# Patient Record
Sex: Male | Born: 1960 | Race: White | Hispanic: No | Marital: Married | Smoking: Former smoker
Health system: Southern US, Community
[De-identification: ages and names within clinical notes are randomized; demographics above are authoritative.]

## PROBLEM LIST (undated history)

## (undated) DIAGNOSIS — J45909 Unspecified asthma, uncomplicated: Secondary | ICD-10-CM

## (undated) DIAGNOSIS — K219 Gastro-esophageal reflux disease without esophagitis: Secondary | ICD-10-CM

## (undated) DIAGNOSIS — G4730 Sleep apnea, unspecified: Secondary | ICD-10-CM

## (undated) DIAGNOSIS — I10 Essential (primary) hypertension: Secondary | ICD-10-CM

## (undated) DIAGNOSIS — Z8489 Family history of other specified conditions: Secondary | ICD-10-CM

## (undated) HISTORY — PX: TONSILLECTOMY: SUR1361

## (undated) HISTORY — PX: WISDOM TOOTH EXTRACTION: SHX21

---

## 2015-05-23 ENCOUNTER — Other Ambulatory Visit: Payer: Self-pay | Admitting: Orthopedic Surgery

## 2015-05-24 ENCOUNTER — Other Ambulatory Visit: Payer: Self-pay | Admitting: Orthopedic Surgery

## 2015-05-24 DIAGNOSIS — M542 Cervicalgia: Secondary | ICD-10-CM

## 2015-05-24 DIAGNOSIS — M25511 Pain in right shoulder: Secondary | ICD-10-CM

## 2015-05-27 ENCOUNTER — Other Ambulatory Visit: Payer: Self-pay | Admitting: Orthopedic Surgery

## 2015-05-27 DIAGNOSIS — Z77018 Contact with and (suspected) exposure to other hazardous metals: Secondary | ICD-10-CM

## 2015-06-20 ENCOUNTER — Ambulatory Visit
Admission: RE | Admit: 2015-06-20 | Discharge: 2015-06-20 | Disposition: A | Discharge status: Home / Self Care | Payer: Commercial Managed Care - HMO | Source: Ambulatory Visit | Attending: Orthopedic Surgery | Admitting: Orthopedic Surgery

## 2015-06-20 ENCOUNTER — Other Ambulatory Visit: Payer: Self-pay

## 2015-06-20 DIAGNOSIS — M25511 Pain in right shoulder: Secondary | ICD-10-CM

## 2015-06-20 DIAGNOSIS — M542 Cervicalgia: Secondary | ICD-10-CM

## 2015-06-20 DIAGNOSIS — Z77018 Contact with and (suspected) exposure to other hazardous metals: Secondary | ICD-10-CM

## 2015-06-20 MED ORDER — IOHEXOL 180 MG/ML  SOLN
15.0000 mL | Freq: Once | INTRAMUSCULAR | Status: DC | PRN
  Administered 2015-06-20: 15 mL via INTRA_ARTICULAR

## 2015-11-14 ENCOUNTER — Other Ambulatory Visit: Payer: Self-pay | Admitting: Orthopedic Surgery

## 2015-11-24 ENCOUNTER — Encounter (HOSPITAL_COMMUNITY): Payer: Self-pay

## 2015-11-24 ENCOUNTER — Encounter (HOSPITAL_COMMUNITY)
Admission: RE | Admit: 2015-11-24 | Discharge: 2015-11-24 | Disposition: A | Discharge status: Home / Self Care | Payer: Managed Care, Other (non HMO) | Source: Ambulatory Visit | Attending: Orthopedic Surgery | Admitting: Orthopedic Surgery

## 2015-11-24 DIAGNOSIS — Z01812 Encounter for preprocedural laboratory examination: Secondary | ICD-10-CM | POA: Insufficient documentation

## 2015-11-24 DIAGNOSIS — M75101 Unspecified rotator cuff tear or rupture of right shoulder, not specified as traumatic: Secondary | ICD-10-CM | POA: Diagnosis not present

## 2015-11-24 HISTORY — DX: Essential (primary) hypertension: I10

## 2015-11-24 HISTORY — DX: Gastro-esophageal reflux disease without esophagitis: K21.9

## 2015-11-24 LAB — BASIC METABOLIC PANEL
ANION GAP: 10 (ref 5–15)
BUN: 18 mg/dL (ref 6–20)
CHLORIDE: 105 mmol/L (ref 101–111)
CO2: 24 mmol/L (ref 22–32)
Calcium: 9.7 mg/dL (ref 8.9–10.3)
Creatinine, Ser: 1.04 mg/dL (ref 0.61–1.24)
GFR calc non Af Amer: >60 mL/min (ref >60)
Glucose, Bld: 115 mg/dL — ABNORMAL HIGH (ref 65–99)
POTASSIUM: 4.2 mmol/L (ref 3.5–5.1)
Sodium: 139 mmol/L (ref 135–145)

## 2015-11-24 LAB — CBC
HEMATOCRIT: 41.2 % (ref 39.0–52.0)
HEMOGLOBIN: 14.2 g/dL (ref 13.0–17.0)
MCH: 30.7 pg (ref 26.0–34.0)
MCHC: 34.5 g/dL (ref 30.0–36.0)
MCV: 89 fL (ref 78.0–100.0)
PLATELETS: 239 10*3/uL (ref 150–400)
RBC: 4.63 MIL/uL (ref 4.22–5.81)
RDW: 12.6 % (ref 11.5–15.5)
WBC: 8.5 10*3/uL (ref 4.0–10.5)

## 2015-11-24 MED ORDER — CHLORHEXIDINE GLUCONATE 4 % EX LIQD: 60.0000 mL | Freq: Once | CUTANEOUS | Status: DC

## 2015-11-24 NOTE — Pre-Procedure Instructions (Signed)
    Keith RaddleMichael P Walters  11/24/2015      [facility] - [place] - [id number] [place] [id number] Phone: [phone] Fax: [phone]    Your procedure is scheduled on December 01, 2015.  [facility]:30 A.M.  Call this number if you have problems the morning of surgery:  [phone]   Remember:  Do not eat food or drink liquids after midnight.  Take these medicines the morning of surgery with A SIP OF WATER : omeprazole (PRILOSEC)   STOP ASPIRIN, HERBAL MEDICATIONS, FISH OIL AS OF TODAY   Do not wear jewelry, make-up or nail polish.  Do not wear lotions, powders, or perfumes.  You may wear deodorant.  Men may shave face and neck.  Do not bring valuables to the hospital.  [facility] is not [facility] for any belongings or valuables.  Contacts, dentures or bridgework may not be worn into surgery.  Leave your suitcase in the car.  After surgery it may be brought to your room.  For patients admitted to the hospital, discharge time will be determined by your treatment team.  Patients discharged the day of surgery will not be allowed to drive home.   Name and phone number of your driver:    Special instructions:  PREPARING FOR SURGERY  Please read over the following fact sheets that you were given. Pain Booklet, Coughing and Deep Breathing and Surgical Site Infection Prevention

## 2015-11-30 MED ORDER — CEFAZOLIN SODIUM-DEXTROSE 2-4 GM/100ML-% IV SOLN
2.0000 g | INTRAVENOUS | Status: AC
  Administered 2015-12-01: 2 g via INTRAVENOUS
  Filled 2015-11-30: qty 100

## 2015-11-30 NOTE — H&P (Signed)
Keith Walters is an 55 y.o. male.   Chief Complaint: Right shoulder pain HPI: Keith NeedleMichael is a 55 year old patient with long history of right shoulder pain.  He has failed conservative management including injections and anti-inflammatories.  He does have a rotator cuff tear by MRI scanning over a year ago.  Reports continued symptoms relating to the right shoulder with coarseness grinding crepitus pain as well as weakness.  He presents now for operative management after expansion risk benefits  Past Medical History  Diagnosis Date  . GERD (gastroesophageal reflux disease)   . Hypertension     No past surgical history on file.  No family history on file. Social History:  has no tobacco, alcohol, and drug history on file.  Allergies:  Allergies  Allergen Reactions  . Codeine     No prescriptions prior to admission    No results found for this or any previous visit (from the past 48 hour(s)). No results found.  Review of Systems  Constitutional: Negative.   HENT: Negative.   Eyes: Negative.   Respiratory: Negative.   Cardiovascular: Negative.   Gastrointestinal: Negative.   Genitourinary: Negative.   Musculoskeletal: Positive for joint pain.  Skin: Negative.   Neurological: Negative.   Endo/Heme/Allergies: Negative.   Psychiatric/Behavioral: Negative.     There were no vitals taken for this visit. Physical Exam  Constitutional: He appears well-developed.  HENT:  Head: Normocephalic.  Eyes: Pupils are equal, round, and reactive to light.  Neck: Normal range of motion.  Cardiovascular: Normal rate.   Respiratory: Effort normal.  Neurological: He is alert.  Skin: Skin is warm.  Psychiatric: He has a normal mood and affect.   right shoulder demonstrates good passive range of motion weakness to suture status testing impingement signs positive no definite before meals joint tenderness direct palpation right versus left shoulder stability is good anterior posterior inferior  testing O'Brien's testing negative  Assessment/Plan Impression is right shoulder rotator cuff tear symptomatic and the patient does fairly physical things plan arthroscopy evaluation the biceps tendon subacromial decompression and mini open rotator cuff repair possible biceps tenodesis if that is indicated based on presence or absence of labral or biceps pathology patient extends the risks and benefits including not limited to infection or vessel damage shoulder stiffness incomplete pain relief piece the rehabilitation time is also discussed.  Patient will use a CPM machine postop all QUESTIONS answered  Keith Walters,Keith Walters SCOTT, MD 11/30/2015, 12:52 PM

## 2015-11-30 NOTE — Anesthesia Preprocedure Evaluation (Addendum)
Anesthesia Evaluation  Patient identified by MRN, date of birth, ID band Patient awake    Reviewed: Allergy & Precautions, NPO status , Patient's Chart, lab work & pertinent test results  Airway Mallampati: II  TM Distance: >3 FB Neck ROM: Full    Dental  (+) Dental Advisory Given   Pulmonary neg pulmonary ROS, former smoker,    breath sounds clear to auscultation       Cardiovascular hypertension, Pt. on medications  Rhythm:Regular Rate:Normal     Neuro/Psych negative neurological ROS     GI/Hepatic Neg liver ROS, GERD  ,  Endo/Other  negative endocrine ROS  Renal/GU negative Renal ROS     Musculoskeletal   Abdominal   Peds  Hematology negative hematology ROS (+)   Anesthesia Other Findings   Reproductive/Obstetrics                            Lab Results  Component Value Date   WBC 8.5 11/24/2015   HGB 14.2 11/24/2015   HCT 41.2 11/24/2015   MCV 89.0 11/24/2015   PLT 239 11/24/2015   Lab Results  Component Value Date   CREATININE 1.04 11/24/2015   BUN 18 11/24/2015   NA 139 11/24/2015   K 4.2 11/24/2015   CL 105 11/24/2015   CO2 24 11/24/2015    Anesthesia Physical Anesthesia Plan  ASA: II  Anesthesia Plan: General and Regional   Post-op Pain Management: GA combined w/ Regional for post-op pain   Induction: Intravenous  Airway Management Planned: Oral ETT  Additional Equipment:   Intra-op Plan:   Post-operative Plan: Extubation in OR  Informed Consent: I have reviewed the patients History and Physical, chart, labs and discussed the procedure including the risks, benefits and alternatives for the proposed anesthesia with the patient or authorized representative who has indicated his/her understanding and acceptance.   Dental advisory given  Plan Discussed with: CRNA  Anesthesia Plan Comments:         Anesthesia Quick Evaluation

## 2015-12-01 ENCOUNTER — Encounter (HOSPITAL_COMMUNITY)
Admission: RE | Disposition: A | Discharge status: Home / Self Care | Payer: Self-pay | Source: Ambulatory Visit | Attending: Orthopedic Surgery

## 2015-12-01 ENCOUNTER — Ambulatory Visit (HOSPITAL_COMMUNITY): Payer: Managed Care, Other (non HMO) | Admitting: Anesthesiology

## 2015-12-01 ENCOUNTER — Ambulatory Visit (HOSPITAL_COMMUNITY)
Admission: RE | Admit: 2015-12-01 | Discharge: 2015-12-01 | Disposition: A | Discharge status: Home / Self Care | Payer: Managed Care, Other (non HMO) | Source: Ambulatory Visit | Attending: Orthopedic Surgery | Admitting: Orthopedic Surgery

## 2015-12-01 ENCOUNTER — Encounter (HOSPITAL_COMMUNITY): Payer: Self-pay | Admitting: *Deleted

## 2015-12-01 DIAGNOSIS — Z87891 Personal history of nicotine dependence: Secondary | ICD-10-CM | POA: Diagnosis not present

## 2015-12-01 DIAGNOSIS — K219 Gastro-esophageal reflux disease without esophagitis: Secondary | ICD-10-CM | POA: Insufficient documentation

## 2015-12-01 DIAGNOSIS — Z79899 Other long term (current) drug therapy: Secondary | ICD-10-CM | POA: Diagnosis not present

## 2015-12-01 DIAGNOSIS — M7521 Bicipital tendinitis, right shoulder: Secondary | ICD-10-CM | POA: Insufficient documentation

## 2015-12-01 DIAGNOSIS — I10 Essential (primary) hypertension: Secondary | ICD-10-CM | POA: Insufficient documentation

## 2015-12-01 DIAGNOSIS — M75101 Unspecified rotator cuff tear or rupture of right shoulder, not specified as traumatic: Secondary | ICD-10-CM | POA: Insufficient documentation

## 2015-12-01 DIAGNOSIS — Z5333 Arthroscopic surgical procedure converted to open procedure: Secondary | ICD-10-CM | POA: Insufficient documentation

## 2015-12-01 DIAGNOSIS — Z7982 Long term (current) use of aspirin: Secondary | ICD-10-CM | POA: Insufficient documentation

## 2015-12-01 HISTORY — PX: SHOULDER ARTHROSCOPY WITH ROTATOR CUFF REPAIR AND SUBACROMIAL DECOMPRESSION: SHX5686

## 2015-12-01 SURGERY — SHOULDER ARTHROSCOPY WITH ROTATOR CUFF REPAIR AND SUBACROMIAL DECOMPRESSION
Anesthesia: Regional | Site: Shoulder | Laterality: Right

## 2015-12-01 MED ORDER — DEXTROSE 5 % IV SOLN
INTRAVENOUS | Status: DC | PRN
Start: 2015-12-01 — End: 2015-12-01
  Administered 2015-12-01: 07:00:00 via INTRAVENOUS

## 2015-12-01 MED ORDER — DEXAMETHASONE SODIUM PHOSPHATE 10 MG/ML IJ SOLN
INTRAMUSCULAR | Status: AC
  Filled 2015-12-01: qty 1

## 2015-12-01 MED ORDER — FENTANYL CITRATE (PF) 250 MCG/5ML IJ SOLN
INTRAMUSCULAR | Status: AC
  Filled 2015-12-01: qty 5

## 2015-12-01 MED ORDER — SUCCINYLCHOLINE CHLORIDE 20 MG/ML IJ SOLN
INTRAMUSCULAR | Status: DC | PRN
  Administered 2015-12-01: 100 mg via INTRAVENOUS

## 2015-12-01 MED ORDER — 0.9 % SODIUM CHLORIDE (POUR BTL) OPTIME
TOPICAL | Status: DC | PRN
  Administered 2015-12-01 (×3): 1000 mL

## 2015-12-01 MED ORDER — PHENYLEPHRINE 40 MCG/ML (10ML) SYRINGE FOR IV PUSH (FOR BLOOD PRESSURE SUPPORT)
PREFILLED_SYRINGE | INTRAVENOUS | Status: AC
  Filled 2015-12-01: qty 10

## 2015-12-01 MED ORDER — EPINEPHRINE HCL 1 MG/ML IJ SOLN
INTRAMUSCULAR | Status: DC | PRN
  Administered 2015-12-01: .1 mL

## 2015-12-01 MED ORDER — HYDROMORPHONE HCL 1 MG/ML IJ SOLN
0.2500 mg | INTRAMUSCULAR | Status: DC | PRN
Start: 2015-12-01 — End: 2015-12-01

## 2015-12-01 MED ORDER — SUGAMMADEX SODIUM 200 MG/2ML IV SOLN
INTRAVENOUS | Status: DC | PRN
  Administered 2015-12-01: 200 mg via INTRAVENOUS

## 2015-12-01 MED ORDER — BUPIVACAINE-EPINEPHRINE (PF) 0.5% -1:200000 IJ SOLN
INTRAMUSCULAR | Status: DC | PRN
  Administered 2015-12-01: 25 mL via PERINEURAL

## 2015-12-01 MED ORDER — KETOROLAC TROMETHAMINE 30 MG/ML IJ SOLN
INTRAMUSCULAR | Status: AC
  Filled 2015-12-01: qty 1

## 2015-12-01 MED ORDER — FENTANYL CITRATE (PF) 100 MCG/2ML IJ SOLN
INTRAMUSCULAR | Status: DC | PRN
  Administered 2015-12-01: 100 ug via INTRAVENOUS
  Administered 2015-12-01 (×2): 50 ug via INTRAVENOUS

## 2015-12-01 MED ORDER — ROCURONIUM BROMIDE 50 MG/5ML IV SOLN
INTRAVENOUS | Status: AC
  Filled 2015-12-01: qty 1

## 2015-12-01 MED ORDER — SUCCINYLCHOLINE CHLORIDE 200 MG/10ML IV SOSY
PREFILLED_SYRINGE | INTRAVENOUS | Status: AC
  Filled 2015-12-01: qty 10

## 2015-12-01 MED ORDER — SUGAMMADEX SODIUM 200 MG/2ML IV SOLN
INTRAVENOUS | Status: AC
  Filled 2015-12-01: qty 2

## 2015-12-01 MED ORDER — ATROPINE SULFATE 0.4 MG/ML IV SOSY
PREFILLED_SYRINGE | INTRAVENOUS | Status: AC
  Filled 2015-12-01: qty 2.5

## 2015-12-01 MED ORDER — METHOCARBAMOL 500 MG PO TABS: 500.0000 mg | ORAL_TABLET | Freq: Four times a day (QID) | ORAL | Status: DC

## 2015-12-01 MED ORDER — DEXAMETHASONE SODIUM PHOSPHATE 10 MG/ML IJ SOLN
INTRAMUSCULAR | Status: DC | PRN
  Administered 2015-12-01: 10 mg via INTRAVENOUS

## 2015-12-01 MED ORDER — SODIUM CHLORIDE 0.9 % IR SOLN
Status: DC | PRN
  Administered 2015-12-01: 3000 mL

## 2015-12-01 MED ORDER — LACTATED RINGERS IV SOLN
INTRAVENOUS | Status: DC | PRN
  Administered 2015-12-01 (×2): via INTRAVENOUS

## 2015-12-01 MED ORDER — MIDAZOLAM HCL 2 MG/2ML IJ SOLN
INTRAMUSCULAR | Status: AC
  Filled 2015-12-01: qty 2

## 2015-12-01 MED ORDER — SODIUM CHLORIDE 0.9 % IJ SOLN
INTRAMUSCULAR | Status: DC | PRN
  Administered 2015-12-01: 50 mL via INTRAVENOUS

## 2015-12-01 MED ORDER — ROCURONIUM BROMIDE 100 MG/10ML IV SOLN
INTRAVENOUS | Status: DC | PRN
Start: 2015-12-01 — End: 2015-12-01
  Administered 2015-12-01: 50 mg via INTRAVENOUS
  Administered 2015-12-01: 20 mg via INTRAVENOUS

## 2015-12-01 MED ORDER — ARTIFICIAL TEARS OP OINT
TOPICAL_OINTMENT | OPHTHALMIC | Status: AC
  Filled 2015-12-01: qty 7

## 2015-12-01 MED ORDER — LIDOCAINE 2% (20 MG/ML) 5 ML SYRINGE
INTRAMUSCULAR | Status: AC
  Filled 2015-12-01: qty 5

## 2015-12-01 MED ORDER — ONDANSETRON HCL 4 MG/2ML IJ SOLN
INTRAMUSCULAR | Status: DC | PRN
  Administered 2015-12-01: 4 mg via INTRAVENOUS

## 2015-12-01 MED ORDER — ONDANSETRON HCL 4 MG/2ML IJ SOLN
INTRAMUSCULAR | Status: AC
  Filled 2015-12-01: qty 2

## 2015-12-01 MED ORDER — MIDAZOLAM HCL 5 MG/5ML IJ SOLN
INTRAMUSCULAR | Status: DC | PRN
  Administered 2015-12-01: 2 mg via INTRAVENOUS

## 2015-12-01 MED ORDER — PROPOFOL 10 MG/ML IV BOLUS
INTRAVENOUS | Status: AC
  Filled 2015-12-01: qty 40

## 2015-12-01 MED ORDER — LIDOCAINE HCL (CARDIAC) 20 MG/ML IV SOLN
INTRAVENOUS | Status: DC | PRN
  Administered 2015-12-01: 20 mg via INTRAVENOUS

## 2015-12-01 MED ORDER — EPHEDRINE 5 MG/ML INJ
INTRAVENOUS | Status: AC
  Filled 2015-12-01: qty 10

## 2015-12-01 MED ORDER — HYDROCODONE-ACETAMINOPHEN 7.5-325 MG PO TABS: 1.0000 | ORAL_TABLET | Freq: Once | ORAL | Status: DC | PRN

## 2015-12-01 MED ORDER — SODIUM CHLORIDE 0.9 % IV SOLN
10.0000 mg | INTRAVENOUS | Status: DC | PRN
  Administered 2015-12-01: 10 ug/min via INTRAVENOUS

## 2015-12-01 MED ORDER — EPINEPHRINE HCL 1 MG/ML IJ SOLN
INTRAMUSCULAR | Status: AC
  Filled 2015-12-01: qty 1

## 2015-12-01 MED ORDER — PROPOFOL 10 MG/ML IV BOLUS
INTRAVENOUS | Status: DC | PRN
  Administered 2015-12-01: 170 mg via INTRAVENOUS

## 2015-12-01 MED ORDER — ARTIFICIAL TEARS OP OINT
TOPICAL_OINTMENT | OPHTHALMIC | Status: DC | PRN
  Administered 2015-12-01: 1 via OPHTHALMIC

## 2015-12-01 MED ORDER — KETOROLAC TROMETHAMINE 30 MG/ML IJ SOLN
30.0000 mg | Freq: Once | INTRAMUSCULAR | Status: AC | PRN
  Administered 2015-12-01: 30 mg via INTRAVENOUS

## 2015-12-01 SURGICAL SUPPLY — 81 items
AID PSTN UNV HD RSTRNT DISP (MISCELLANEOUS) ×1
ANCH SUT 2 FT CRKSW 14.7X5.5 (Anchor) ×2 IMPLANT
ANCH SUT PUSHLCK 19.5X3.5 STRL (Anchor) IMPLANT
ANCH SUT PUSHLCK 24X4.5 STRL (Orthopedic Implant) ×2 IMPLANT
ANCHOR CORKSCREW FIBER 5.5X15 (Anchor) ×4 IMPLANT
ANCHOR PUSHLOCK PEEK 3.5X19.5 (Anchor) IMPLANT
APL SKNCLS STERI-STRIP NONHPOA (GAUZE/BANDAGES/DRESSINGS) ×1
BENZOIN TINCTURE PRP APPL 2/3 (GAUZE/BANDAGES/DRESSINGS) ×3 IMPLANT
BLADE CUTTER GATOR 3.5 (BLADE) ×3 IMPLANT
BLADE GREAT WHITE 4.2 (BLADE) ×2 IMPLANT
BLADE GREAT WHITE 4.2MM (BLADE) ×1
BLADE SURG 11 STRL SS (BLADE) ×3 IMPLANT
BUR OVAL 6.0 (BURR) ×3 IMPLANT
CLOSURE WOUND 1/2 X4 (GAUZE/BANDAGES/DRESSINGS) ×1
COVER SURGICAL LIGHT HANDLE (MISCELLANEOUS) ×3 IMPLANT
DRAPE INCISE IOBAN 66X45 STRL (DRAPES) ×6 IMPLANT
DRAPE STERI 35X30 U-POUCH (DRAPES) ×3 IMPLANT
DRAPE U-SHAPE 47X51 STRL (DRAPES) ×6 IMPLANT
DRSG ADAPTIC 3X8 NADH LF (GAUZE/BANDAGES/DRESSINGS) ×2 IMPLANT
DRSG PAD ABDOMINAL 8X10 ST (GAUZE/BANDAGES/DRESSINGS) ×9 IMPLANT
DRSG TEGADERM 4X4.75 (GAUZE/BANDAGES/DRESSINGS) ×4 IMPLANT
DURAPREP 26ML APPLICATOR (WOUND CARE) ×3 IMPLANT
ELECT REM PT RETURN 9FT ADLT (ELECTROSURGICAL) ×3
ELECTRODE REM PT RTRN 9FT ADLT (ELECTROSURGICAL) ×1 IMPLANT
FILTER STRAW FLUID ASPIR (MISCELLANEOUS) ×3 IMPLANT
GAUZE SPONGE 4X4 12PLY STRL (GAUZE/BANDAGES/DRESSINGS) ×3 IMPLANT
GAUZE XEROFORM 1X8 LF (GAUZE/BANDAGES/DRESSINGS) ×3 IMPLANT
GLOVE BIOGEL PI IND STRL 7.5 (GLOVE) ×1 IMPLANT
GLOVE BIOGEL PI IND STRL 8 (GLOVE) ×1 IMPLANT
GLOVE BIOGEL PI INDICATOR 7.5 (GLOVE) ×2
GLOVE BIOGEL PI INDICATOR 8 (GLOVE) ×2
GLOVE ECLIPSE 7.0 STRL STRAW (GLOVE) ×3 IMPLANT
GLOVE SURG ORTHO 8.0 STRL STRW (GLOVE) ×3 IMPLANT
GOWN STRL REUS W/ TWL LRG LVL3 (GOWN DISPOSABLE) IMPLANT
GOWN STRL REUS W/ TWL XL LVL3 (GOWN DISPOSABLE) ×2 IMPLANT
GOWN STRL REUS W/TWL LRG LVL3 (GOWN DISPOSABLE) ×6
GOWN STRL REUS W/TWL XL LVL3 (GOWN DISPOSABLE) ×6
KIT BASIN OR (CUSTOM PROCEDURE TRAY) ×3 IMPLANT
KIT BIO-TENODESIS 3X8 DISP (MISCELLANEOUS) ×3
KIT INSRT BABSR STRL DISP BTN (MISCELLANEOUS) IMPLANT
KIT ROOM TURNOVER OR (KITS) ×3 IMPLANT
MANIFOLD NEPTUNE II (INSTRUMENTS) ×3 IMPLANT
NDL HYPO 25X1 1.5 SAFETY (NEEDLE) ×1 IMPLANT
NDL SCORPION MULTI FIRE (NEEDLE) ×1 IMPLANT
NDL SPNL 18GX3.5 QUINCKE PK (NEEDLE) ×1 IMPLANT
NDL SUT 6 .5 CRC .975X.05 MAYO (NEEDLE) ×1 IMPLANT
NEEDLE HYPO 25X1 1.5 SAFETY (NEEDLE) ×3 IMPLANT
NEEDLE MAYO TAPER (NEEDLE) ×3
NEEDLE SCORPION MULTI FIRE (NEEDLE) ×3 IMPLANT
NEEDLE SPNL 18GX3.5 QUINCKE PK (NEEDLE) ×3 IMPLANT
NS IRRIG 1000ML POUR BTL (IV SOLUTION) ×7 IMPLANT
PACK SHOULDER (CUSTOM PROCEDURE TRAY) ×3 IMPLANT
PAD ARMBOARD 7.5X6 YLW CONV (MISCELLANEOUS) ×6 IMPLANT
PUSHLOCK PEEK 4.5X24 (Orthopedic Implant) ×4 IMPLANT
RESTRAINT HEAD UNIVERSAL NS (MISCELLANEOUS) ×3 IMPLANT
SCREW TENODESIS BIOCOMP 7MM (Screw) ×2 IMPLANT
SET ARTHROSCOPY TUBING (MISCELLANEOUS) ×3
SET ARTHROSCOPY TUBING LN (MISCELLANEOUS) ×1 IMPLANT
SLING ARM IMMOBILIZER LRG (SOFTGOODS) ×2 IMPLANT
SLING ARM IMMOBILIZER MED (SOFTGOODS) IMPLANT
SPONGE LAP 4X18 X RAY DECT (DISPOSABLE) ×6 IMPLANT
STRIP CLOSURE SKIN 1/2X4 (GAUZE/BANDAGES/DRESSINGS) ×2 IMPLANT
SUCTION FRAZIER HANDLE 10FR (MISCELLANEOUS) ×2
SUCTION TUBE FRAZIER 10FR DISP (MISCELLANEOUS) ×1 IMPLANT
SUT ETHILON 3 0 PS 1 (SUTURE) ×3 IMPLANT
SUT FIBERWIRE #2 38 T-5 BLUE (SUTURE)
SUT PROLENE 3 0 PS 2 (SUTURE) ×3 IMPLANT
SUT VIC AB 0 CT1 27 (SUTURE) ×6
SUT VIC AB 0 CT1 27XBRD ANBCTR (SUTURE) ×2 IMPLANT
SUT VIC AB 1 CT1 27 (SUTURE) ×3
SUT VIC AB 1 CT1 27XBRD ANBCTR (SUTURE) ×1 IMPLANT
SUT VIC AB 2-0 CT1 27 (SUTURE) ×3
SUT VIC AB 2-0 CT1 TAPERPNT 27 (SUTURE) ×1 IMPLANT
SUT VICRYL 0 UR6 27IN ABS (SUTURE) ×3 IMPLANT
SUTURE FIBERWR #2 38 T-5 BLUE (SUTURE) IMPLANT
SYR 20CC LL (SYRINGE) ×6 IMPLANT
SYR TB 1ML LUER SLIP (SYRINGE) ×3 IMPLANT
TOWEL OR 17X24 6PK STRL BLUE (TOWEL DISPOSABLE) ×3 IMPLANT
TOWEL OR 17X26 10 PK STRL BLUE (TOWEL DISPOSABLE) ×3 IMPLANT
WAND HAND CNTRL MULTIVAC 90 (MISCELLANEOUS) ×2 IMPLANT
WATER STERILE IRR 1000ML POUR (IV SOLUTION) ×3 IMPLANT

## 2015-12-01 NOTE — Anesthesia Postprocedure Evaluation (Signed)
Anesthesia Post Note  Patient: Keith Walters  Procedure(s) Performed: Procedure(s) (LRB): RIGHT SHOULDER ARTHROSCOPY WITH SUBACROMIAL DECOMPRESSION, MINI-OPEN ROTATOR CUFF REPAIR AND  BICEPS TENODESIS (Right)  Patient location during evaluation: PACU Anesthesia Type: General and Regional Level of consciousness: awake and alert Pain management: pain level controlled Vital Signs Assessment: post-procedure vital signs reviewed and stable Respiratory status: spontaneous breathing, nonlabored ventilation and respiratory function stable Cardiovascular status: blood pressure returned to baseline and stable Postop Assessment: no signs of nausea or vomiting Anesthetic complications: no    Last Vitals:  Filed Vitals:   12/01/15 1130 12/01/15 1145  BP:  142/91  Pulse: 85 83  Temp:    Resp: 21     Last Pain:  Filed Vitals:   12/01/15 1151  PainSc: 0-No pain                 Kennieth RadFitzgerald, Indy Prestwood E

## 2015-12-01 NOTE — Op Note (Signed)
NAMLilyan Punt:  Walters, Keith Walters                ACCOUNT NO.:  [id number]  MEDICAL RECORD NO.:  [id number]  LOCATION:  [facility]                         FACILITY:  [facility]  PHYSICIAN:  Burnard BuntingG. Scott Kamdin Follett, M.D.    DATE OF BIRTH:  1960-10-21  DATE OF PROCEDURE: DATE OF DISCHARGE:                              OPERATIVE REPORT   PREOPERATIVE DIAGNOSIS:  Right shoulder biceps tendonitis and rotator cuff tear.  POSTOPERATIVE DIAGNOSIS:  Right shoulder biceps tendonitis and rotator cuff tear.  PROCEDURE:  Right shoulder arthroscopy with subacromial decompression, biceps tendon release, labral debridement, mini open rotator cuff repair of a 2 x 1.5-cm rotator cuff tear.  SURGEON:  Burnard BuntingG. Scott Ayan Yankey, M.D.  ASSISTANT:  Patrick Jupiterarla Bethune, RNFA.  INDICATIONS:  Keith NeedleMichael is a patient with right shoulder pain, long duration, rotator cuff tear by MRI scanning, presents for operative management after explanation of risks and benefits.  OPERATIVE FINDINGS: 1. Examination under anesthesia, range of motion, 0-170 of forward     flexion, external rotation of 15 degrees, abduction on the right is     about 60 degrees.  The patient had good stability, anterior and     posterior with less than a 1 cm sulcus sign. 2. Diagnostic arthroscopy:     a.     Little bit of early synovitis within the superior capsular      region and on the remaining portion of the rotator cuff.     b.     Frayed labrum and proximal biceps with significant      tendinosis of the intra-articular portion of the biceps with      swelling.     c.     Tear of the rotator cuff with significant tendinosis present      with a tear measuring about 2 cm front to back, 1.5 cm lateral to      medial.  PROCEDURE IN DETAIL:  The patient was brought to the operating room where general anesthetic was induced.  Preoperative antibiotics were administered.  Time-out was called.  Right shoulder was prescrubbed with alcohol and Betadine, allowed to air dry, prepped with  DuraPrep solution and draped in a sterile manner.  The patient was placed in a beach-chair position with the head in neutral position.  Time-out was called.  Collier Flowersoban was used to cover the axilla and most of the operative field during the arthroscopy all of the operative field for the mini open rotator cuff repair portion.  Solution of saline and epinephrine injected in the subacromial space, saline then injected into the glenohumeral joint. Diagnostic arthroscopy was performed through the posterior portal created 2 cm medial and inferior to the posterolateral margin of the acromion.  Diagnostic arthroscopy was performed.  The patient had a type 2 SLAP tear with significant proximal degeneration of the biceps tendon with tendinosis.  He also had a rotator cuff tear, 2 cm x 1.5 cm.  Early synovitis also present in the posterior aspect of the rotator cuff and capsule.  Anterior portal created under direct visualization.  Biceps tendon released.  Labrum debrided.  Rotator cuff tear debrided.  At this time, scope placed in the subacromial space.  Lateral  portal created under direct visualization.  Bursectomy performed.  CA ligament was released, but not resected off the anterolateral part of the acromion. Acromioplasty was performed as the rotator cuff tear was directly under the anterolateral portion of the acromion.  At this time, instruments were removed.  Portals were closed using 3-0 nylon.  Collier Flowers was used to cover the entire operative field.  Incision was made off the anterolateral margin of the acromion.  Skin and subcutaneous tissue were sharply divided.  Deltoid split in its raphe between the anterior and middle portions, avascular zone, stay suture placed 4 cm from the anterolateral margin of the acromion.  Subacromial decompression adequate.  Biceps tendon was then tenodesed using Arthrex 7 x 22-mm tenodesis screw, tenodesed within the bicipital groove.  Good fixation and tension were  achieved.  At this time, rotator cuff was identified, footprint prepared using a curette.  Devitalized portions of the torn rotator cuff was debrided.  Two corkscrews, two PushLocks were then utilized with crossed mattress technique in order to achieve a watertight repair.  Thorough irrigation was then performed.  Deltoid split closed using 0 Vicryl suture followed by interrupted inverted 2-0 Vicryl suture and 3-0 Monocryl.  Impervious dressing was placed.  The patient tolerated the procedure well without immediate complication, transferred to the recovery room in stable condition.     Burnard Bunting, M.D.     GSD/MEDQ  D:  12/01/2015  T:  12/01/2015  Job:  [id number]

## 2015-12-01 NOTE — Transfer of Care (Signed)
Immediate Anesthesia Transfer of Care Note  Patient: Keith RaddleMichael P Walters  Procedure(s) Performed: Procedure(s): RIGHT SHOULDER ARTHROSCOPY WITH SUBACROMIAL DECOMPRESSION, MINI-OPEN ROTATOR CUFF REPAIR AND  BICEPS TENODESIS (Right)  Patient Location: PACU  Anesthesia Type:GA combined with regional for post-op pain  Level of Consciousness: sedated, patient cooperative and responds to stimulation  Airway & Oxygen Therapy: Patient Spontanous Breathing and Patient connected to face mask oxygen  Post-op Assessment: Report given to RN and Post -op Vital signs reviewed and stable  Post vital signs: Reviewed and stable  Last Vitals:  Filed Vitals:   12/01/15 0559  BP: 126/97  Pulse: 67  Temp: 36.4 C  Resp: 20    Last Pain: There were no vitals filed for this visit.       Complications: No apparent anesthesia complications

## 2015-12-01 NOTE — Interval H&P Note (Signed)
History and Physical Interval Note:  12/01/2015 7:30 AM  Keith Walters  has presented today for surgery, with the diagnosis of right shoulder rotator cuff tear  The various methods of treatment have been discussed with the patient and family. After consideration of risks, benefits and other options for treatment, the patient has consented to  Procedure(s): RIGHT SHOULDER ARTHROSCOPY WITH SUBACROMIAL DECOMPRESSION, MINI-OPEN ROTATOR CUFF REPAIR AND POSSIBLE BICEPS TENODESIS (Right) as a surgical intervention .  The patient's history has been reviewed, patient examined, no change in status, stable for surgery.  I have reviewed the patient's chart and labs.  Questions were answered to the patient's satisfaction.     Shawnee Gambone SCOTT

## 2015-12-01 NOTE — Anesthesia Procedure Notes (Addendum)
Anesthesia Regional Block:  Interscalene brachial plexus block  Pre-Anesthetic Checklist: ,, timeout performed, Correct Patient, Correct Site, Correct Laterality, Correct Procedure, Correct Position, site marked, Risks and benefits discussed,  Surgical consent,  Pre-op evaluation,  At surgeon's request and post-op pain management  Laterality: Right  Prep: chloraprep       Needles:  Injection technique: Single-shot  Needle Type: Echogenic Stimulator Needle     Needle Length: 9cm 9 cm Needle Gauge: 21 and 21 G    Additional Needles:  Procedures: ultrasound guided (picture in chart) and nerve stimulator Interscalene brachial plexus block  Nerve Stimulator or Paresthesia:  Response: deltoid and bicep, 0.5 mA,   Additional Responses:   Narrative:  Start time: 12/01/2015 7:10 AM End time: 12/01/2015 7:18 AM Injection made incrementally with aspirations every 5 mL.  Performed by: Personally  Anesthesiologist: Marcene DuosFITZGERALD, ROBERT   Procedure Name: Intubation Date/Time: 12/01/2015 7:43 AM Performed by: Wray KearnsFOLEY, Aleida Crandell A Pre-anesthesia Checklist: Patient identified, Emergency Drugs available, Suction available, Patient being monitored and Timeout performed Patient Re-evaluated:Patient Re-evaluated prior to inductionOxygen Delivery Method: Circle system utilized Preoxygenation: Pre-oxygenation with 100% oxygen Intubation Type: IV induction and Cricoid Pressure applied Ventilation: Oral airway inserted - appropriate to patient size and Mask ventilation with difficulty Laryngoscope Size: Mac and 4 Grade View: Grade I Tube type: Oral Tube size: 7.5 mm Number of attempts: 1 Airway Equipment and Method: Stylet Placement Confirmation: ETT inserted through vocal cords under direct vision,  positive ETCO2 and breath sounds checked- equal and bilateral Secured at: 23 cm Tube secured with: Tape Dental Injury: Teeth and Oropharynx as per pre-operative assessment

## 2015-12-01 NOTE — Brief Op Note (Signed)
12/01/2015  10:02 AM  PATIENT:  Keith Walters  55 y.o. male  PRE-OPERATIVE DIAGNOSIS:  right shoulder rotator cuff tear  POST-OPERATIVE DIAGNOSIS:  right shoulder rotator cuff tear  PROCEDURE:  Procedure(s): RIGHT SHOULDER ARTHROSCOPY WITH SUBACROMIAL DECOMPRESSION, MINI-OPEN ROTATOR CUFF REPAIR AND  BICEPS TENODESIS  SURGEON:  Surgeon(s): Cammy CopaScott Gregory Dean, MD  ASSISTANT: Patrick Jupiterarla Bethune rnfa  ANESTHESIA:   general  EBL: 25 ml    Total I/O In: 600 [I.V.:600] Out: 25 [Blood:25]  BLOOD ADMINISTERED: none  DRAINS: none   LOCAL MEDICATIONS USED:  none  SPECIMEN:  No Specimen  COUNTS:  YES  TOURNIQUET:  * No tourniquets in log *  DICTATION: .Other Dictation: Dictation Number [id number]  PLAN OF CARE: Discharge to home after PACU  PATIENT DISPOSITION:  PACU - hemodynamically stable

## 2015-12-04 ENCOUNTER — Encounter (HOSPITAL_COMMUNITY): Payer: Self-pay | Admitting: Orthopedic Surgery

## 2016-01-20 ENCOUNTER — Other Ambulatory Visit: Payer: Self-pay | Admitting: Orthopedic Surgery

## 2016-01-20 DIAGNOSIS — M25511 Pain in right shoulder: Secondary | ICD-10-CM

## 2016-01-30 ENCOUNTER — Ambulatory Visit
Admission: RE | Admit: 2016-01-30 | Discharge: 2016-01-30 | Disposition: A | Discharge status: Home / Self Care | Payer: Managed Care, Other (non HMO) | Source: Ambulatory Visit | Attending: Orthopedic Surgery | Admitting: Orthopedic Surgery

## 2016-01-30 DIAGNOSIS — M25511 Pain in right shoulder: Secondary | ICD-10-CM

## 2016-01-30 MED ORDER — IOPAMIDOL (ISOVUE-M 200) INJECTION 41%
12.0000 mL | Freq: Once | INTRAMUSCULAR | Status: AC
  Administered 2016-01-30: 12 mL via INTRA_ARTICULAR

## 2016-03-22 ENCOUNTER — Other Ambulatory Visit: Payer: Self-pay | Admitting: Orthopedic Surgery

## 2016-03-28 ENCOUNTER — Other Ambulatory Visit: Payer: Self-pay | Admitting: Orthopedic Surgery

## 2016-04-01 ENCOUNTER — Encounter (HOSPITAL_COMMUNITY): Payer: Self-pay

## 2016-04-01 ENCOUNTER — Encounter (HOSPITAL_COMMUNITY)
Admission: RE | Admit: 2016-04-01 | Discharge: 2016-04-01 | Disposition: A | Discharge status: Home / Self Care | Payer: Managed Care, Other (non HMO) | Source: Ambulatory Visit | Attending: Orthopedic Surgery | Admitting: Orthopedic Surgery

## 2016-04-01 DIAGNOSIS — M75101 Unspecified rotator cuff tear or rupture of right shoulder, not specified as traumatic: Secondary | ICD-10-CM | POA: Diagnosis not present

## 2016-04-01 DIAGNOSIS — Z01812 Encounter for preprocedural laboratory examination: Secondary | ICD-10-CM | POA: Insufficient documentation

## 2016-04-01 HISTORY — DX: Family history of other specified conditions: Z84.89

## 2016-04-01 HISTORY — DX: Sleep apnea, unspecified: G47.30

## 2016-04-01 HISTORY — DX: Unspecified asthma, uncomplicated: J45.909

## 2016-04-01 LAB — CBC
HCT: 40 % (ref 39.0–52.0)
Hemoglobin: 13.3 g/dL (ref 13.0–17.0)
MCH: 30.4 pg (ref 26.0–34.0)
MCHC: 33.3 g/dL (ref 30.0–36.0)
MCV: 91.3 fL (ref 78.0–100.0)
Platelets: 233 10*3/uL (ref 150–400)
RBC: 4.38 MIL/uL (ref 4.22–5.81)
RDW: 12.2 % (ref 11.5–15.5)
WBC: 7.4 10*3/uL (ref 4.0–10.5)

## 2016-04-01 LAB — BASIC METABOLIC PANEL
Anion gap: 9 (ref 5–15)
BUN: 18 mg/dL (ref 6–20)
CALCIUM: 9.4 mg/dL (ref 8.9–10.3)
CO2: 24 mmol/L (ref 22–32)
Chloride: 107 mmol/L (ref 101–111)
Creatinine, Ser: 0.9 mg/dL (ref 0.61–1.24)
GFR calc Af Amer: >60 mL/min (ref >60)
GLUCOSE: 92 mg/dL (ref 65–99)
Potassium: 4.1 mmol/L (ref 3.5–5.1)
Sodium: 140 mmol/L (ref 135–145)

## 2016-04-01 NOTE — Pre-Procedure Instructions (Signed)
Keith Walters  04/01/2016      [facility] - [place] - [id number] [place] [id number] Phone: [phone] Fax: [phone]    Your procedure is scheduled on September 1  [facility] Ad[facility] at [employer] A.M.  Call this number if you have problems the morning of surgery:  [phone]   Remember:  Do not eat food or drink liquids after midnight.   Take these medicines the morning of surgery with A SIP OF WATER omeprazole (prilosec)  7 days prior to surgery STOP taking any Aspirin, Aleve, Naproxen, Ibuprofen, Motrin, Advil, Goody's, BC's, all herbal medications, fish oil, and all vitamins    Do not wear jewelry..  Do not wear lotions, powders, or cologne, or deoderant.   Men may shave face and neck.  Do not bring valuables to the hospital.  [facility] is not responsible for any belongings or valuables.  Contacts, dentures or bridgework may not be worn into surgery.  Leave your suitcase in the car.  After surgery it may be brought to your room.  For patients admitted to the hospital, discharge time will be determined by your treatment team.  Patients discharged the day of surgery will not be allowed to drive home.    Special instructions:   [place]- Preparing For Surgery  Before surgery, you can play an important role. Because skin is not sterile, your skin needs to be as free of germs as possible. You can reduce the number of germs on your skin by washing with CHG (chlorahexidine gluconate) Soap before surgery.  CHG is an antiseptic cleaner which kills germs and bonds with the skin to continue killing germs even after washing.  Please do not use if you have an allergy to CHG or antibacterial soaps. If your skin becomes reddened/irritated stop using the CHG.  Do not shave (including legs and underarms) for at least 48 hours prior to first CHG shower. It is OK to shave your face.  Please  follow these instructions carefully.   1. Shower the NIGHT BEFORE SURGERY and the MORNING OF SURGERY with CHG.   2. If you chose to wash your hair, wash your hair first as usual with your normal shampoo.  3. After you shampoo, rinse your hair and body thoroughly to remove the shampoo.  4. Use CHG as you would any other liquid soap. You can apply CHG directly to the skin and wash gently with a scrungie or a clean washcloth.   5. Apply the CHG Soap to your body ONLY FROM THE NECK DOWN.  Do not use on open wounds or open sores. Avoid contact with your eyes, ears, mouth and genitals (private parts). Wash genitals (private parts) with your normal soap.  6. Wash thoroughly, paying special attention to the area where your surgery will be performed.  7. Thoroughly rinse your body with warm water from the neck down.  8. DO NOT shower/wash with your normal soap after using and rinsing off the CHG Soap.  9. Pat yourself dry with a CLEAN TOWEL.   10. Wear CLEAN PAJAMAS   11. Place CLEAN SHEETS on your bed the night of your first shower and DO NOT SLEEP WITH PETS.    Day of Surgery: Do not apply any deodorants/lotions. Please wear clean clothes to the hospital/surgery center.      Please read over the following fact sheets that you were  given. Pain Booklet, Coughing and Deep Breathing and Surgical Site Infection Prevention

## 2016-04-01 NOTE — Progress Notes (Signed)
PCP - Lupita RaiderKimberlee Shaw Cardiologist - denies  Chest x-ray - not needed  EKG - requesting from Dr Clelia Croftshaw completed back in may Stress Test - denies ECHO - denies Cardiac Cath - denies  Patient wears CPAP settings 4.5 instructed him to bring mask    Patient denies shortness of breath, fever, cough and chest pain at PAT appointment

## 2016-04-04 MED ORDER — CEFAZOLIN SODIUM-DEXTROSE 2-4 GM/100ML-% IV SOLN
2.0000 g | INTRAVENOUS | Status: AC
  Administered 2016-04-05: 2 g via INTRAVENOUS
  Filled 2016-04-04: qty 100

## 2016-04-05 ENCOUNTER — Ambulatory Visit (HOSPITAL_COMMUNITY): Payer: Managed Care, Other (non HMO) | Admitting: Emergency Medicine

## 2016-04-05 ENCOUNTER — Ambulatory Visit (HOSPITAL_COMMUNITY): Payer: Managed Care, Other (non HMO) | Admitting: Anesthesiology

## 2016-04-05 ENCOUNTER — Encounter (HOSPITAL_COMMUNITY): Payer: Self-pay | Admitting: Certified Registered"

## 2016-04-05 ENCOUNTER — Encounter (HOSPITAL_COMMUNITY)
Admission: RE | Disposition: A | Discharge status: Home / Self Care | Payer: Self-pay | Source: Ambulatory Visit | Attending: Orthopedic Surgery

## 2016-04-05 ENCOUNTER — Ambulatory Visit (HOSPITAL_COMMUNITY)
Admission: RE | Admit: 2016-04-05 | Discharge: 2016-04-05 | Disposition: A | Discharge status: Home / Self Care | Payer: Managed Care, Other (non HMO) | Source: Ambulatory Visit | Attending: Orthopedic Surgery | Admitting: Orthopedic Surgery

## 2016-04-05 DIAGNOSIS — K219 Gastro-esophageal reflux disease without esophagitis: Secondary | ICD-10-CM | POA: Insufficient documentation

## 2016-04-05 DIAGNOSIS — Z87891 Personal history of nicotine dependence: Secondary | ICD-10-CM | POA: Diagnosis not present

## 2016-04-05 DIAGNOSIS — I10 Essential (primary) hypertension: Secondary | ICD-10-CM | POA: Insufficient documentation

## 2016-04-05 DIAGNOSIS — Z7982 Long term (current) use of aspirin: Secondary | ICD-10-CM | POA: Insufficient documentation

## 2016-04-05 DIAGNOSIS — M75111 Incomplete rotator cuff tear or rupture of right shoulder, not specified as traumatic: Secondary | ICD-10-CM | POA: Insufficient documentation

## 2016-04-05 DIAGNOSIS — M75101 Unspecified rotator cuff tear or rupture of right shoulder, not specified as traumatic: Secondary | ICD-10-CM | POA: Diagnosis present

## 2016-04-05 DIAGNOSIS — G4730 Sleep apnea, unspecified: Secondary | ICD-10-CM | POA: Diagnosis not present

## 2016-04-05 DIAGNOSIS — J45909 Unspecified asthma, uncomplicated: Secondary | ICD-10-CM | POA: Insufficient documentation

## 2016-04-05 HISTORY — PX: SHOULDER ARTHROSCOPY WITH OPEN ROTATOR CUFF REPAIR: SHX6092

## 2016-04-05 SURGERY — ARTHROSCOPY, SHOULDER WITH REPAIR, ROTATOR CUFF, OPEN
Anesthesia: Regional | Site: Shoulder | Laterality: Right

## 2016-04-05 MED ORDER — LACTATED RINGERS IV SOLN
INTRAVENOUS | Status: DC
  Administered 2016-04-05: 09:00:00 via INTRAVENOUS

## 2016-04-05 MED ORDER — SUGAMMADEX SODIUM 200 MG/2ML IV SOLN
INTRAVENOUS | Status: DC | PRN
  Administered 2016-04-05: 200 mg via INTRAVENOUS

## 2016-04-05 MED ORDER — LACTATED RINGERS IV SOLN: INTRAVENOUS | Status: DC

## 2016-04-05 MED ORDER — ONDANSETRON HCL 4 MG/2ML IJ SOLN
INTRAMUSCULAR | Status: DC | PRN
  Administered 2016-04-05: 4 mg via INTRAVENOUS

## 2016-04-05 MED ORDER — BUPIVACAINE-EPINEPHRINE (PF) 0.5% -1:200000 IJ SOLN
INTRAMUSCULAR | Status: DC | PRN
  Administered 2016-04-05: 30 mL via PERINEURAL

## 2016-04-05 MED ORDER — FENTANYL CITRATE (PF) 100 MCG/2ML IJ SOLN
INTRAMUSCULAR | Status: AC
  Administered 2016-04-05: 100 ug
  Filled 2016-04-05: qty 2

## 2016-04-05 MED ORDER — METOCLOPRAMIDE HCL 5 MG/ML IJ SOLN: 10.0000 mg | Freq: Once | INTRAMUSCULAR | Status: DC | PRN

## 2016-04-05 MED ORDER — SUGAMMADEX SODIUM 500 MG/5ML IV SOLN
INTRAVENOUS | Status: AC
  Filled 2016-04-05: qty 5

## 2016-04-05 MED ORDER — PROPOFOL 10 MG/ML IV BOLUS
INTRAVENOUS | Status: DC | PRN
  Administered 2016-04-05: 190 mg via INTRAVENOUS
  Administered 2016-04-05: 40 mg via INTRAVENOUS

## 2016-04-05 MED ORDER — LIDOCAINE HCL (CARDIAC) 20 MG/ML IV SOLN
INTRAVENOUS | Status: DC | PRN
  Administered 2016-04-05: 100 mg via INTRAVENOUS

## 2016-04-05 MED ORDER — FENTANYL CITRATE (PF) 100 MCG/2ML IJ SOLN
INTRAMUSCULAR | Status: DC | PRN
  Administered 2016-04-05: 50 ug via INTRAVENOUS

## 2016-04-05 MED ORDER — SODIUM CHLORIDE 0.9 % IR SOLN
Status: DC | PRN
  Administered 2016-04-05 (×2): 1000 mL

## 2016-04-05 MED ORDER — LIDOCAINE 2% (20 MG/ML) 5 ML SYRINGE
INTRAMUSCULAR | Status: AC
  Filled 2016-04-05: qty 10

## 2016-04-05 MED ORDER — MEPERIDINE HCL 25 MG/ML IJ SOLN: 6.2500 mg | INTRAMUSCULAR | Status: DC | PRN

## 2016-04-05 MED ORDER — PROPOFOL 10 MG/ML IV BOLUS
INTRAVENOUS | Status: AC
  Filled 2016-04-05: qty 20

## 2016-04-05 MED ORDER — ONDANSETRON HCL 4 MG/2ML IJ SOLN
INTRAMUSCULAR | Status: AC
  Filled 2016-04-05: qty 2

## 2016-04-05 MED ORDER — FENTANYL CITRATE (PF) 100 MCG/2ML IJ SOLN: 25.0000 ug | INTRAMUSCULAR | Status: DC | PRN

## 2016-04-05 MED ORDER — PHENYLEPHRINE HCL 10 MG/ML IJ SOLN
INTRAMUSCULAR | Status: DC | PRN
  Administered 2016-04-05: 40 ug/min via INTRAVENOUS

## 2016-04-05 MED ORDER — ROCURONIUM BROMIDE 100 MG/10ML IV SOLN
INTRAVENOUS | Status: DC | PRN
  Administered 2016-04-05: 50 mg via INTRAVENOUS

## 2016-04-05 MED ORDER — MIDAZOLAM HCL 2 MG/2ML IJ SOLN
INTRAMUSCULAR | Status: AC
  Administered 2016-04-05: 2 mg
  Filled 2016-04-05: qty 2

## 2016-04-05 MED ORDER — ROCURONIUM BROMIDE 10 MG/ML (PF) SYRINGE
PREFILLED_SYRINGE | INTRAVENOUS | Status: AC
  Filled 2016-04-05: qty 10

## 2016-04-05 MED ORDER — CHLORHEXIDINE GLUCONATE 4 % EX LIQD: 60.0000 mL | Freq: Once | CUTANEOUS | Status: DC

## 2016-04-05 MED ORDER — SODIUM CHLORIDE 0.9 % IJ SOLN
INTRAMUSCULAR | Status: DC | PRN
  Administered 2016-04-05: 20 mL via INTRAVENOUS

## 2016-04-05 MED ORDER — FENTANYL CITRATE (PF) 100 MCG/2ML IJ SOLN
INTRAMUSCULAR | Status: AC
  Filled 2016-04-05: qty 2

## 2016-04-05 MED ORDER — STERILE WATER FOR IRRIGATION IR SOLN
Status: DC | PRN
  Administered 2016-04-05: 1

## 2016-04-05 SURGICAL SUPPLY — 78 items
AID PSTN UNV HD RSTRNT DISP (MISCELLANEOUS) ×1
ANCH SUT PUSHLCK 19.5X3.5 STRL (Anchor) ×1 IMPLANT
ANCH SUT PUSHLCK 24X4.5 STRL (Orthopedic Implant) ×1 IMPLANT
ANCHOR CORKSCREW BIO 6.5 W/SUT (Anchor) ×2 IMPLANT
ANCHOR PUSHLOCK PEEK 3.5X19.5 (Anchor) ×1 IMPLANT
APL SKNCLS STERI-STRIP NONHPOA (GAUZE/BANDAGES/DRESSINGS) ×1
BENZOIN TINCTURE PRP APPL 2/3 (GAUZE/BANDAGES/DRESSINGS) ×2 IMPLANT
BLADE CUTTER GATOR 3.5 (BLADE) ×2 IMPLANT
BLADE GREAT WHITE 4.2 (BLADE) ×2 IMPLANT
BLADE SURG 11 STRL SS (BLADE) ×2 IMPLANT
BUR OVAL 6.0 (BURR) ×2 IMPLANT
CLSR STERI-STRIP ANTIMIC 1/2X4 (GAUZE/BANDAGES/DRESSINGS) ×1 IMPLANT
COVER SURGICAL LIGHT HANDLE (MISCELLANEOUS) ×2 IMPLANT
DRAPE INCISE IOBAN 66X45 STRL (DRAPES) ×4 IMPLANT
DRAPE STERI 35X30 U-POUCH (DRAPES) ×2 IMPLANT
DRAPE U-SHAPE 47X51 STRL (DRAPES) ×4 IMPLANT
DRSG PAD ABDOMINAL 8X10 ST (GAUZE/BANDAGES/DRESSINGS) ×6 IMPLANT
DRSG TEGADERM 4X4.75 (GAUZE/BANDAGES/DRESSINGS) ×1 IMPLANT
DURAPREP 26ML APPLICATOR (WOUND CARE) ×2 IMPLANT
ELECT REM PT RETURN 9FT ADLT (ELECTROSURGICAL) ×2
ELECTRODE REM PT RTRN 9FT ADLT (ELECTROSURGICAL) ×1 IMPLANT
FILTER STRAW FLUID ASPIR (MISCELLANEOUS) ×2 IMPLANT
GAUZE SPONGE 4X4 12PLY STRL (GAUZE/BANDAGES/DRESSINGS) ×2 IMPLANT
GAUZE XEROFORM 1X8 LF (GAUZE/BANDAGES/DRESSINGS) ×2 IMPLANT
GLOVE BIOGEL PI IND STRL 7.5 (GLOVE) ×1 IMPLANT
GLOVE BIOGEL PI IND STRL 8 (GLOVE) ×1 IMPLANT
GLOVE BIOGEL PI INDICATOR 7.5 (GLOVE) ×1
GLOVE BIOGEL PI INDICATOR 8 (GLOVE) ×1
GLOVE ECLIPSE 7.0 STRL STRAW (GLOVE) ×2 IMPLANT
GLOVE SURG ORTHO 8.0 STRL STRW (GLOVE) ×2 IMPLANT
GOWN STRL REUS W/ TWL LRG LVL3 (GOWN DISPOSABLE) IMPLANT
GOWN STRL REUS W/ TWL XL LVL3 (GOWN DISPOSABLE) ×2 IMPLANT
GOWN STRL REUS W/TWL LRG LVL3 (GOWN DISPOSABLE)
GOWN STRL REUS W/TWL XL LVL3 (GOWN DISPOSABLE) ×4
KIT BASIN OR (CUSTOM PROCEDURE TRAY) ×2 IMPLANT
KIT ROOM TURNOVER OR (KITS) ×2 IMPLANT
MANIFOLD NEPTUNE II (INSTRUMENTS) ×2 IMPLANT
NDL HYPO 25X1 1.5 SAFETY (NEEDLE) ×1 IMPLANT
NDL SCORPION MULTI FIRE (NEEDLE) ×1 IMPLANT
NDL SPNL 18GX3.5 QUINCKE PK (NEEDLE) ×1 IMPLANT
NDL SUT 6 .5 CRC .975X.05 MAYO (NEEDLE) ×1 IMPLANT
NEEDLE HYPO 25X1 1.5 SAFETY (NEEDLE) ×2 IMPLANT
NEEDLE MAYO TAPER (NEEDLE) ×2
NEEDLE SCORPION MULTI FIRE (NEEDLE) ×4 IMPLANT
NEEDLE SPNL 18GX3.5 QUINCKE PK (NEEDLE) ×2 IMPLANT
NS IRRIG 1000ML POUR BTL (IV SOLUTION) ×2 IMPLANT
PACK SHOULDER (CUSTOM PROCEDURE TRAY) ×2 IMPLANT
PAD ARMBOARD 7.5X6 YLW CONV (MISCELLANEOUS) ×4 IMPLANT
PUSHLOCK PEEK 4.5X24 (Orthopedic Implant) ×1 IMPLANT
RESTRAINT HEAD UNIVERSAL NS (MISCELLANEOUS) ×2 IMPLANT
SET ARTHROSCOPY TUBING (MISCELLANEOUS) ×2
SET ARTHROSCOPY TUBING LN (MISCELLANEOUS) ×1 IMPLANT
SLING ARM IMMOBILIZER MED (SOFTGOODS) IMPLANT
SPONGE GAUZE 4X4 12PLY STER LF (GAUZE/BANDAGES/DRESSINGS) ×1 IMPLANT
SPONGE LAP 4X18 X RAY DECT (DISPOSABLE) ×4 IMPLANT
STRIP CLOSURE SKIN 1/2X4 (GAUZE/BANDAGES/DRESSINGS) ×3 IMPLANT
SUCTION FRAZIER HANDLE 10FR (MISCELLANEOUS) ×1
SUCTION TUBE FRAZIER 10FR DISP (MISCELLANEOUS) ×1 IMPLANT
SUT ETHILON 3 0 PS 1 (SUTURE) ×2 IMPLANT
SUT FIBERWIRE #2 38 T-5 BLUE (SUTURE)
SUT FIBERWIRE 2-0 18 17.9 3/8 (SUTURE) ×4
SUT MNCRL AB 3-0 PS2 18 (SUTURE) ×1 IMPLANT
SUT PROLENE 3 0 PS 2 (SUTURE) ×2 IMPLANT
SUT VIC AB 0 CT1 27 (SUTURE) ×4
SUT VIC AB 0 CT1 27XBRD ANBCTR (SUTURE) ×2 IMPLANT
SUT VIC AB 1 CT1 27 (SUTURE) ×2
SUT VIC AB 1 CT1 27XBRD ANBCTR (SUTURE) ×1 IMPLANT
SUT VIC AB 2-0 CT1 27 (SUTURE) ×6
SUT VIC AB 2-0 CT1 TAPERPNT 27 (SUTURE) ×1 IMPLANT
SUT VICRYL 0 UR6 27IN ABS (SUTURE) ×2 IMPLANT
SUTURE FIBERWR #2 38 T-5 BLUE (SUTURE) IMPLANT
SUTURE FIBERWR 2-0 18 17.9 3/8 (SUTURE) IMPLANT
SYR 20CC LL (SYRINGE) ×4 IMPLANT
SYR TB 1ML LUER SLIP (SYRINGE) ×2 IMPLANT
TOWEL OR 17X24 6PK STRL BLUE (TOWEL DISPOSABLE) ×2 IMPLANT
TOWEL OR 17X26 10 PK STRL BLUE (TOWEL DISPOSABLE) ×2 IMPLANT
WAND HAND CNTRL MULTIVAC 90 (MISCELLANEOUS) IMPLANT
WATER STERILE IRR 1000ML POUR (IV SOLUTION) ×2 IMPLANT

## 2016-04-05 NOTE — Transfer of Care (Signed)
Immediate Anesthesia Transfer of Care Note  Patient: Keith RaddleMichael P Walters  Procedure(s) Performed: Procedure(s): SHOULDER ARTHROSCOPY WITH MINI-OPEN ROTATOR CUFF REPAIR (Right)  Patient Location: PACU  Anesthesia Type:GA combined with regional for post-op pain  Level of Consciousness: awake, alert , oriented and patient cooperative  Airway & Oxygen Therapy: Patient Spontanous Breathing and Patient connected to nasal cannula oxygen  Post-op Assessment: Report given to RN, Post -op Vital signs reviewed and stable and Patient moving all extremities  Post vital signs: Reviewed and stable  Last Vitals:  Vitals:   04/05/16 0839  BP: 138/87  Pulse: 76  Resp: 20  Temp: 36.9 C    Last Pain:  Vitals:   04/05/16 0839  TempSrc: Oral         Complications: No apparent anesthesia complications

## 2016-04-05 NOTE — Anesthesia Preprocedure Evaluation (Signed)
Anesthesia Evaluation  Patient identified by MRN, date of birth, ID band Patient awake    Reviewed: Allergy & Precautions, NPO status , Patient's Chart, lab work & pertinent test results  Airway Mallampati: II  TM Distance: >3 FB Neck ROM: Full    Dental  (+) Dental Advisory Given   Pulmonary sleep apnea , former smoker,    breath sounds clear to auscultation       Cardiovascular hypertension, Pt. on medications  Rhythm:Regular Rate:Normal     Neuro/Psych negative neurological ROS     GI/Hepatic Neg liver ROS, GERD  ,  Endo/Other  negative endocrine ROS  Renal/GU negative Renal ROS     Musculoskeletal   Abdominal   Peds  Hematology negative hematology ROS (+)   Anesthesia Other Findings   Reproductive/Obstetrics                             Lab Results  Component Value Date   WBC 7.4 04/01/2016   HGB 13.3 04/01/2016   HCT 40.0 04/01/2016   MCV 91.3 04/01/2016   PLT 233 04/01/2016   Lab Results  Component Value Date   CREATININE 0.90 04/01/2016   BUN 18 04/01/2016   NA 140 04/01/2016   K 4.1 04/01/2016   CL 107 04/01/2016   CO2 24 04/01/2016    Anesthesia Physical  Anesthesia Plan  ASA: II  Anesthesia Plan: General and Regional   Post-op Pain Management: GA combined w/ Regional for post-op pain   Induction: Intravenous  Airway Management Planned: Oral ETT  Additional Equipment:   Intra-op Plan:   Post-operative Plan: Extubation in OR  Informed Consent: I have reviewed the patients History and Physical, chart, labs and discussed the procedure including the risks, benefits and alternatives for the proposed anesthesia with the patient or authorized representative who has indicated his/her understanding and acceptance.   Dental advisory given  Plan Discussed with: CRNA  Anesthesia Plan Comments:         Anesthesia Quick Evaluation

## 2016-04-05 NOTE — Anesthesia Procedure Notes (Signed)
Anesthesia Regional Block:  Supraclavicular block  Pre-Anesthetic Checklist: ,, timeout performed, Correct Patient, Correct Site, Correct Laterality, Correct Procedure, Correct Position, site marked, Risks and benefits discussed,  Surgical consent,  Pre-op evaluation,  At surgeon's request and post-op pain management  Laterality: Right and Upper  Prep: Maximum Sterile Barrier Precautions used, chloraprep       Needles:  Injection technique: Single-shot  Needle Type: Echogenic Stimulator Needle     Needle Length: 10cm 10 cm Needle Gauge: 21 G    Additional Needles:  Procedures: ultrasound guided (picture in chart) Supraclavicular block Narrative:  Injection made incrementally with aspirations every 5 mL.  Performed by: Personally   Additional Notes: Risks, benefits and alternative to block explained extensively.  Patient tolerated procedure well, without complications.      

## 2016-04-05 NOTE — Anesthesia Procedure Notes (Signed)
Procedure Name: Intubation Date/Time: 04/05/2016 11:18 AM Performed by: Lucinda DellECARLO, Raimi Guillermo M Pre-anesthesia Checklist: Patient identified, Emergency Drugs available, Suction available and Patient being monitored Patient Re-evaluated:Patient Re-evaluated prior to inductionOxygen Delivery Method: Circle system utilized Preoxygenation: Pre-oxygenation with 100% oxygen Intubation Type: IV induction Ventilation: Mask ventilation without difficulty Laryngoscope Size: Mac and 4 Grade View: Grade II Tube type: Oral Tube size: 7.5 mm Number of attempts: 1 Airway Equipment and Method: Stylet Placement Confirmation: ETT inserted through vocal cords under direct vision,  positive ETCO2 and breath sounds checked- equal and bilateral Secured at: 22 cm Tube secured with: Tape Dental Injury: Teeth and Oropharynx as per pre-operative assessment

## 2016-04-05 NOTE — Progress Notes (Signed)
Report to Phillip Lopez RN as primary caregiver. 

## 2016-04-05 NOTE — H&P (Signed)
Keith Walters is an 55 y.o. male.   Chief Complaint: Right shoulder pain HPI: Keith Walters is a 55 year old patient with right shoulder pain.  He underwent right shoulder rotator cuff repair in April of this year.  Did well within the first 3-4 weeks postoperatively to developed recurrent grinding in coarseness.  He also developed some pain.  Had an early return to work.  Subsequent MRI scanning demonstrated re-tear of the rotator cuff about 1 cm.  He presents now for operative management of the recurrent rotator cuff re-tear with subsequent adjustment of his postop regimen.  Past Medical History:  Diagnosis Date  . Asthma    as a child  . Family history of adverse reaction to anesthesia    adverse reaction to all anesthesia  . GERD (gastroesophageal reflux disease)   . Hypertension   . Sleep apnea     Past Surgical History:  Procedure Laterality Date  . SHOULDER ARTHROSCOPY WITH ROTATOR CUFF REPAIR AND SUBACROMIAL DECOMPRESSION Right 12/01/2015   Procedure: RIGHT SHOULDER ARTHROSCOPY WITH SUBACROMIAL DECOMPRESSION, MINI-OPEN ROTATOR CUFF REPAIR AND  BICEPS TENODESIS;  Surgeon: Cammy Copa, MD;  Location: [facility] OR;  Service: Orthopedics;  Laterality: Right;  . TONSILLECTOMY    . WISDOM TOOTH EXTRACTION      History reviewed. No pertinent family history. Social History:  reports that he quit smoking about 8 years ago. His smoking use included Cigarettes. He has a 10.00 pack-year smoking history. He has never used smokeless tobacco. He reports that he does not drink alcohol or use drugs.  Allergies:  Allergies  Allergen Reactions  . Codeine Other (See Comments)    UNSPECIFIED REACTION     Medications Prior to Admission  Medication Sig Dispense Refill  . aspirin EC 81 MG tablet Take 81 mg by mouth daily.    Marland Kitchen lisinopril (PRINIVIL,ZESTRIL) 20 MG tablet Take 20 mg by mouth daily.    . Multiple Vitamin (MULTIVITAMIN WITH MINERALS) TABS tablet Take 1 tablet by mouth every other day.      Marland Kitchen NIACIN PO Take 1 tablet by mouth every other day.     . Omega-3 Fatty Acids (FISH OIL) 1000 MG CAPS Take 1,000 mg by mouth daily.    Marland Kitchen omeprazole (PRILOSEC) 20 MG capsule Take 20 mg by mouth daily.      No results found for this or any previous visit (from the past 48 hour(s)). No results found.  Review of Systems  Musculoskeletal: Positive for joint pain.  All other systems reviewed and are negative.   Blood pressure 138/87, pulse 76, temperature 98.4 F (36.9 C), temperature source Oral, resp. rate 20, height 5\' 7"  (1.702 m), weight 84.3 kg (185 lb 14 oz), SpO2 97 %. Physical Exam  Constitutional: He appears well-developed.  HENT:  Head: Normocephalic.  Eyes: Pupils are equal, round, and reactive to light.  Neck: Normal range of motion.  Cardiovascular: Normal rate.   Respiratory: Effort normal.  Neurological: He is alert.  Skin: Skin is warm.  Psychiatric: He has a normal mood and affect.   examination right shoulder demonstrates a well-healed surgical incision he does have some coarseness with passive range of motion internal/external rotation at 90 abduction and cuff strength generally is pretty good to infraspinatus 6 space subscap muscle testing.  Biceps tendon has been tenodesed.  Assessment/Plan Impression is right shoulder rotator cuff re-tear plan arthroscopy with recurrent mini open rotator cuff tear repair with adjusted postoperative rehabilitation regimen which is less aggressive.  Less pressure this  Around to return to work.  We'll not start CPM machine for at least a week postop.  All questions answered  Cammy CopaEAN,Juhi Lagrange SCOTT, MD 04/05/2016, 10:45 AM

## 2016-04-05 NOTE — Brief Op Note (Signed)
04/05/2016  1:05 PM  PATIENT:  Keith Walters  55 y.o. male  PRE-OPERATIVE DIAGNOSIS:  RIGHT SHOULDER ROTATOR CUFF TEAR  POST-OPERATIVE DIAGNOSIS:  same  PROCEDURE:  Procedure(s): SHOULDER ARTHROSCOPY WITH MINI-OPEN ROTATOR CUFF REPAIR  SURGEON:  Surgeon(s): Cammy CopaScott Gregory Dean, MD  ASSISTANT: Patrick Jupiterarla Bethune rnfa  ANESTHESIA:   regional and general  EBL: 30 ml    Total I/O In: 700 [I.V.:700] Out: 75 [Blood:75]  BLOOD ADMINISTERED: none  DRAINS: none   LOCAL MEDICATIONS USED:  none  SPECIMEN:  No Specimen  COUNTS:  YES  TOURNIQUET:  * No tourniquets in log *  DICTATION: .Other Dictation: Dictation Number [id number]  PLAN OF CARE: Discharge to home after PACU  PATIENT DISPOSITION:  PACU - hemodynamically stable

## 2016-04-08 NOTE — Op Note (Signed)
[name], [name] NO.:  [id number]  MEDICAL RECORD NO.:  [id number]  LOCATION:                                 FACILITY:  PHYSICIAN:  Burnard Bunting, M.D.    DATE OF BIRTH:  June 07, 1961  DATE OF PROCEDURE: DATE OF DISCHARGE:                              OPERATIVE REPORT   PREOPERATIVE DIAGNOSIS:  Recurrent right shoulder rotator cuff tear.  POSTOPERATIVE DIAGNOSIS:  Recurrent right shoulder rotator cuff tear.  PROCEDURE:  Right shoulder arthroscopy with mini-open rotator cuff tear re-repair.  SURGEON:  Burnard Bunting, MD.  ASSISTANT:  Patrick Jupiter, RNFA.  INDICATIONS:  Keith Walters is a patient, who underwent surgery 5 months ago, had recurrent rotator cuff tear, presents for operative management after explanation of risks and benefits.  OPERATIVE FINDINGS: 1. Examination under anesthesia, range of motion, full forward     flexion, external rotation of 50 degrees and abduction is about 60     degrees.  Shoulder stability is excellent.  Anterior and posterior     testing. 2. Diagnostic arthroscopy:     a.     Generally intact glenohumeral joint but with some early      synovitis.     b.     Recurrent rotator cuff tear measuring about 1 cm with some      retraction of the previous repair.     c.     Adequate subacromial decompression.  PROCEDURE IN DETAIL:  The patient was brought to the operating room where general anesthesic was induced.  Preoperative antibiotics were administered.  Time-out was called.  The patient was placed in a beach- chair position with the head in neutral position.  Right arm, shoulder, and axilla prescrubbed with alcohol and Betadine, allowed to air dry, prepped with DuraPrep solution and draped in a sterile manner.  Collier Flowers was used to cover the operative field.  Solution of saline injected into the shoulder joint.  A posterior portal created 2 cm medial and inferior to the posterolateral margin of the acromion.  Diagnostic  arthroscopy was performed.  Small recurrent rotator cuff tear from the bursal side was identified.  This was examined and found to represent what was shown on the MRI scan.  Some mild early synovitis was present in the capsule. At this time, the scope was removed.  Prior incision was utilized. Deltoid split and measured distance of 4 cm from the anterolateral margin of the acromion.  Stay suture was placed.  In general, the rotator cuff tear was more of a tear that had retracted back away from the from the suture.  It was macerated.  There was also a bony protuberance, which was at the greater tuberosity, which was not really associated with the implant.  This was felt to potentially be giving him some bursitis.  This bony protuberance was removed.  The sutures from the prior repair were removed.  The tear measured about 2 cm x 2 cm.  It was easily mobilized.  There was a partial-thickness component infraspinatus tear, which was sutured using interrupted inverted 2-0 FiberWire sutures.  This measured about 1.5 cm.  The footprint was then prepared using  a curette.  Tuberoplasty was performed.  The old anchors were removed, which were the corkscrew anchors.  These holes were in good position for the re-repair.  New __________ corkscrew anchors were placed.  The sutures were then placed more medial to a more healthy- appearing rotator cuff tendon.  In addition to that, 2-0 FiberWire suture was placed in a grasping fashion through the lateral ends of the tendon, which were then also incorporated into the PushLocks.  At this time, 8 suture strands were placed.  The 4 knots were tied with a traction placed on the 2-0 FiberWire suture.  This gave very easily mobilizable tear, which mobilized nicely to cover entirely the footprint.  __________ 0.5% anterior, 0.5% posterior.  The anterior 2-0 FiberWires were placed in the anterior PushLock, 2-0 FiberWires were placed in the posterior PushLock and  a watertight repair was achieved. The PushLock holes utilized were different holes.  At this time, thorough irrigation was performed.  Watertight repair was achieved. Pictures were taken.  Deltoid split was then closed using 0 Vicryl suture followed by interrupted inverted 2-0 Vicryl suture and a Monocryl.  The patient tolerated the procedure well without immediate complication, transferred to the recovery room in stable condition.  The patient tolerated procedure well without immediate complications.     Burnard BuntingG. Scott Kalinda Romaniello, M.D.   ______________________________ Reece AgarG. Dorene GrebeScott Ameshia Pewitt, M.D.    GSD/MEDQ  D:  04/05/2016  T:  04/06/2016  Job:  [id number]

## 2016-04-11 ENCOUNTER — Encounter (HOSPITAL_COMMUNITY): Payer: Self-pay | Admitting: Orthopedic Surgery

## 2016-04-26 NOTE — Anesthesia Postprocedure Evaluation (Signed)
Anesthesia Post Note  Patient: Keith RaddleMichael P Walters  Procedure(s) Performed: Procedure(s) (LRB): SHOULDER ARTHROSCOPY WITH MINI-OPEN ROTATOR CUFF REPAIR (Right)  Patient location during evaluation: PACU Anesthesia Type: General and Regional Level of consciousness: awake and alert Pain management: satisfactory to patient Vital Signs Assessment: post-procedure vital signs reviewed and stable Respiratory status: spontaneous breathing, nonlabored ventilation, respiratory function stable and patient connected to nasal cannula oxygen Cardiovascular status: blood pressure returned to baseline and stable Postop Assessment: no signs of nausea or vomiting Anesthetic complications: no    Last Vitals:  Vitals:   04/05/16 1454 04/05/16 1500  BP: 123/83   Pulse: 67 75  Resp: 18 18  Temp:  36.6 C    Last Pain:  Vitals:   04/05/16 1515  TempSrc:   PainSc: 0-No pain                 Tula Schryver,JAMES TERRILL

## 2016-05-15 ENCOUNTER — Ambulatory Visit (INDEPENDENT_AMBULATORY_CARE_PROVIDER_SITE_OTHER): Payer: Managed Care, Other (non HMO) | Admitting: Orthopedic Surgery

## 2016-05-15 DIAGNOSIS — M75121 Complete rotator cuff tear or rupture of right shoulder, not specified as traumatic: Secondary | ICD-10-CM

## 2016-06-12 ENCOUNTER — Encounter (INDEPENDENT_AMBULATORY_CARE_PROVIDER_SITE_OTHER): Payer: Self-pay | Admitting: Orthopedic Surgery

## 2016-06-12 ENCOUNTER — Ambulatory Visit (INDEPENDENT_AMBULATORY_CARE_PROVIDER_SITE_OTHER): Payer: Managed Care, Other (non HMO) | Admitting: Orthopedic Surgery

## 2016-06-12 DIAGNOSIS — M75121 Complete rotator cuff tear or rupture of right shoulder, not specified as traumatic: Secondary | ICD-10-CM

## 2016-06-12 NOTE — Progress Notes (Signed)
   Post-Op Visit Note   Patient: Keith Walters           Date of Birth: Oct 31, 1960           MRN: [id number] Visit Date: 06/12/2016 PCP: Lupita RaiderSHAW,KIMBERLEE, MD   Assessment & Plan:  Chief Complaint:  Chief Complaint  Patient presents with  . Right Shoulder - Routine Post Op   Visit Diagnoses:  1. Complete tear of right rotator cuff     Plan: Keith Walters is a 55 year old patient 8 weeks out redo right shoulder rotator cuff tear repair he's been doing well on exam he's got excellent range of motion no course grinding or crepitus with active or passive range of motion of the shoulder cuff strength is improving plan on him to continue his current activity level.  I don't really want to do any strengthening yet because it does want to get this thing another month to heal think he should be okay to return to work regular duty 07/05/2016.  I'll see him back in 6 weeks which will be 2 weeks after his return to work.  Follow-Up Instructions: Return in about 6 weeks (around 07/24/2016).   Orders:  No orders of the defined types were placed in this encounter.  No orders of the defined types were placed in this encounter.    PMFS History: There are no active problems to display for this patient.  Past Medical History:  Diagnosis Date  . Asthma    as a child  . Family history of adverse reaction to anesthesia    adverse reaction to all anesthesia  . GERD (gastroesophageal reflux disease)   . Hypertension   . Sleep apnea     No family history on file.  Past Surgical History:  Procedure Laterality Date  . SHOULDER ARTHROSCOPY WITH OPEN ROTATOR CUFF REPAIR Right 04/05/2016   Procedure: SHOULDER ARTHROSCOPY WITH MINI-OPEN ROTATOR CUFF REPAIR;  Surgeon: Cammy CopaScott Jaivyn Gulla, MD;  Location: [facility] OR;  Service: Orthopedics;  Laterality: Right;  . SHOULDER ARTHROSCOPY WITH ROTATOR CUFF REPAIR AND SUBACROMIAL DECOMPRESSION Right 12/01/2015   Procedure: RIGHT SHOULDER ARTHROSCOPY WITH SUBACROMIAL  DECOMPRESSION, MINI-OPEN ROTATOR CUFF REPAIR AND  BICEPS TENODESIS;  Surgeon: Cammy CopaScott Kalandra Masters, MD;  Location: [facility] OR;  Service: Orthopedics;  Laterality: Right;  . TONSILLECTOMY    . WISDOM TOOTH EXTRACTION     Social History   Occupational History  . Not on file.   Social History Main Topics  . Smoking status: Former Smoker    Packs/day: 1.00    Years: 10.00    Types: Cigarettes    Quit date: 12/01/2007  . Smokeless tobacco: Never Used  . Alcohol use No  . Drug use: No  . Sexual activity: Not on file

## 2016-07-24 ENCOUNTER — Ambulatory Visit (INDEPENDENT_AMBULATORY_CARE_PROVIDER_SITE_OTHER): Payer: Self-pay | Admitting: Orthopedic Surgery

## 2016-07-24 ENCOUNTER — Encounter (INDEPENDENT_AMBULATORY_CARE_PROVIDER_SITE_OTHER): Payer: Self-pay | Admitting: Orthopedic Surgery

## 2016-07-24 DIAGNOSIS — M75121 Complete rotator cuff tear or rupture of right shoulder, not specified as traumatic: Secondary | ICD-10-CM | POA: Insufficient documentation

## 2016-07-24 NOTE — Progress Notes (Signed)
   Post-Op Visit Note   Patient: Keith BoersMichael P Kepner           Date of Birth: [birth year]           MRN: [id number] Visit Date: 07/24/2016 PCP: Lupita RaiderSHAW,KIMBERLEE, MD   Assessment & Plan:  Chief Complaint:  Chief Complaint  Patient presents with  . Right Shoulder - Routine Post Op   Visit Diagnoses:  1. Complete tear of right rotator cuff     Plan: Casimiro NeedleMichael is now 15 weeks out from right shoulder revision cuff repair is doing well he is back at work since 12 1 he has no mechanical symptoms in the shoulder.  Does have some occasional aching but in general he's doing well and is fully functional.  On exam he has full active and passive range of motion and improving strength.  No course grinding or crepitus with active or passive range of motion of the arm plan at this time is to release him.  His neck pain is also less.  I think in general he's doing well and I will see him back as needed  Follow-Up Instructions: Return if symptoms worsen or fail to improve.   Orders:  No orders of the defined types were placed in this encounter.  No orders of the defined types were placed in this encounter.    PMFS History: Patient Active Problem List   Diagnosis Date Noted  . Complete tear of right rotator cuff 07/24/2016   Past Medical History:  Diagnosis Date  . Asthma    as a child  . Family history of adverse reaction to anesthesia    adverse reaction to all anesthesia  . GERD (gastroesophageal reflux disease)   . Hypertension   . Sleep apnea     No family history on file.  Past Surgical History:  Procedure Laterality Date  . SHOULDER ARTHROSCOPY WITH OPEN ROTATOR CUFF REPAIR Right 04/05/2016   Procedure: SHOULDER ARTHROSCOPY WITH MINI-OPEN ROTATOR CUFF REPAIR;  Surgeon: Cammy CopaScott Gregory Dean, MD;  Location: [facility] OR;  Service: Orthopedics;  Laterality: Right;  . SHOULDER ARTHROSCOPY WITH ROTATOR CUFF REPAIR AND SUBACROMIAL DECOMPRESSION Right 12/01/2015   Procedure: RIGHT SHOULDER ARTHROSCOPY WITH  SUBACROMIAL DECOMPRESSION, MINI-OPEN ROTATOR CUFF REPAIR AND  BICEPS TENODESIS;  Surgeon: Cammy CopaScott Gregory Dean, MD;  Location: [facility];  Service: Orthopedics;  Laterality: Right;  . TONSILLECTOMY    . WISDOM TOOTH EXTRACTION     Social History   Occupational History  . Not on file.   Social History Main Topics  . Smoking status: Former Smoker    Packs/day: 1.00    Years: 10.00    Types: Cigarettes    Quit date: 12/01/2007  . Smokeless tobacco: Never Used  . Alcohol use No  . Drug use: No  . Sexual activity: Not on file

## 2017-04-29 IMAGING — MR MR SHOULDER*R* W/CM
6 series · 40 of 40 positions shown · IV contrast (agent unspecified)
Comparison: MRI 06/20/2015

CLINICAL DATA: Right shoulder surgery 8 weeks ago for a rotator
cuff repair. Pain, clicking and popping 2-1/2 weeks ago.

EXAM:
MR ARTHROGRAM OF THE right SHOULDER
TECHNIQUE: Multiplanar, multisequence MR imaging of the right shoulder was
performed following the administration of intra-articular contrast.
CONTRAST:  See Injection Documentation.

[Series 4: T2 fat-sat · oblique · 4.0mm · 0.55mm/px · 8 of 17 slices shown (1 of 2)]
[im 1/17]
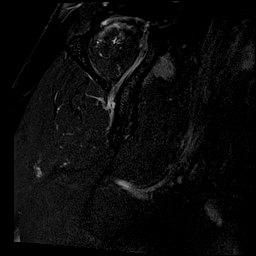
[im 3/17]
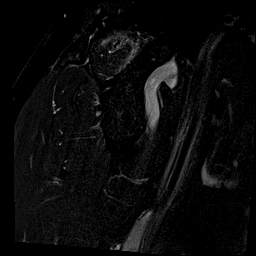
[im 5/17]
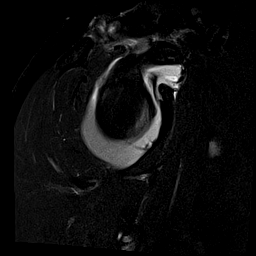
[im 7/17]
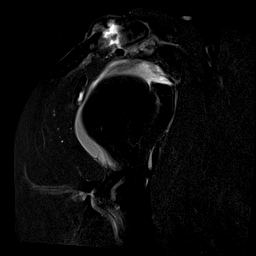
[im 10/17]
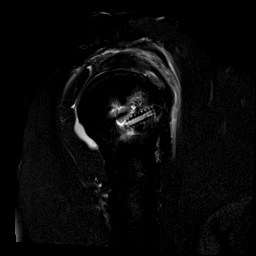
[im 12/17]
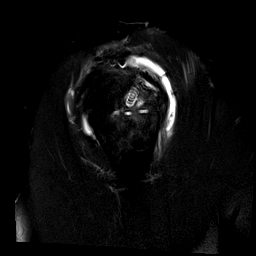
[im 14/17]
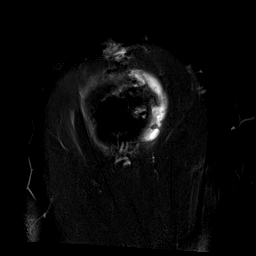
[im 17/17]
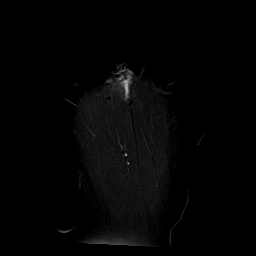

[Series 5: T1 fat-sat · oblique · 4.0mm · 0.44mm/px · 7 of 16 slices shown (1 of 4)]
[im 1/16]
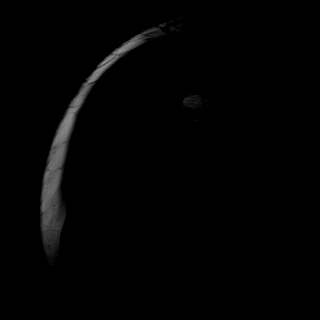
[im 3/16]
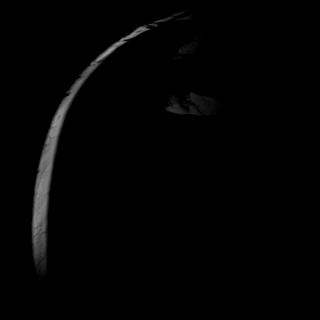
[im 6/16]
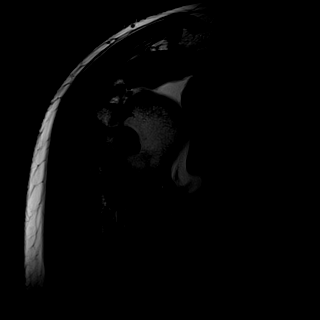
[im 8/16]
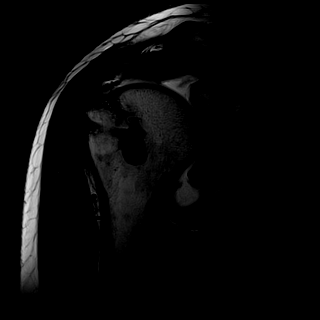
[im 11/16]
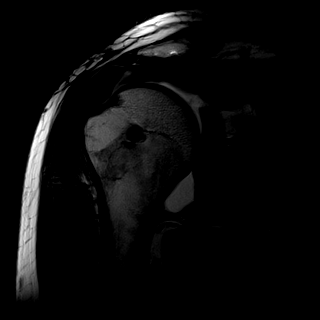
[im 13/16]
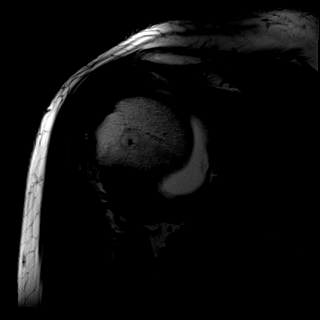
[im 16/16]
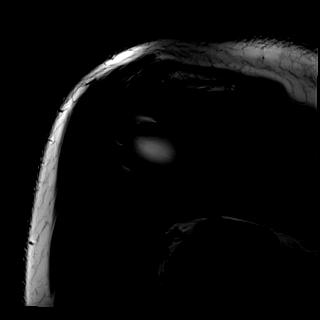

[Series 6: T1 fat-sat · axial · 4.0mm · 0.23mm/px · z∈[-34,+43]mm · 7 of 18 slices shown (2 of 4)]
[im 1/18]
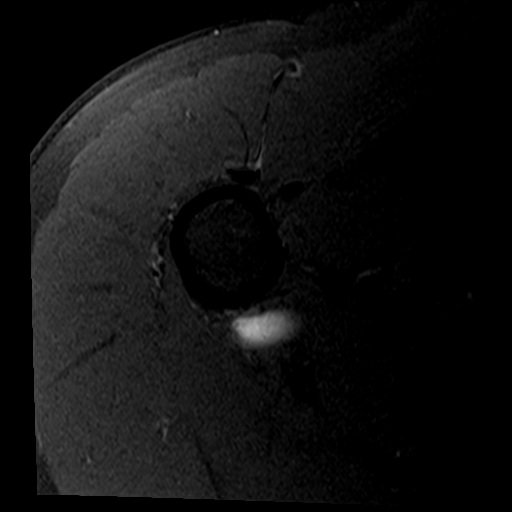
[im 3/18]
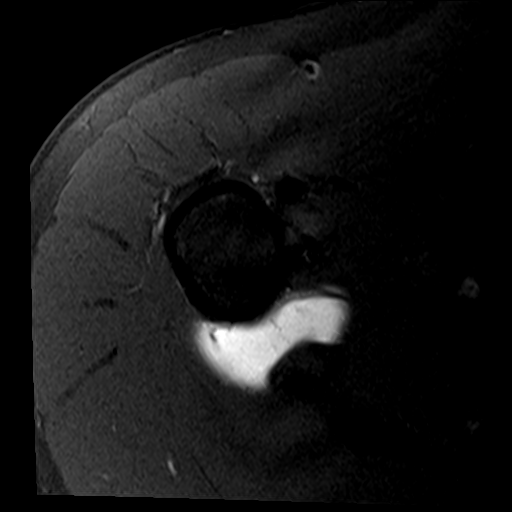
[im 6/18]
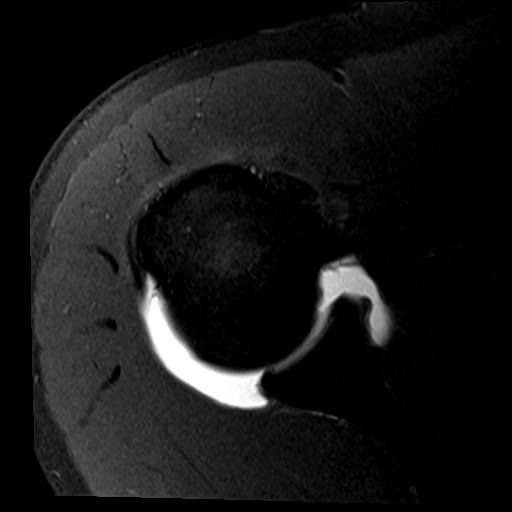
[im 9/18]
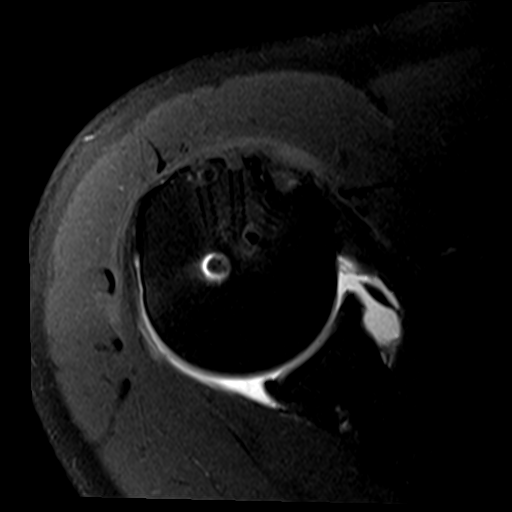
[im 12/18]
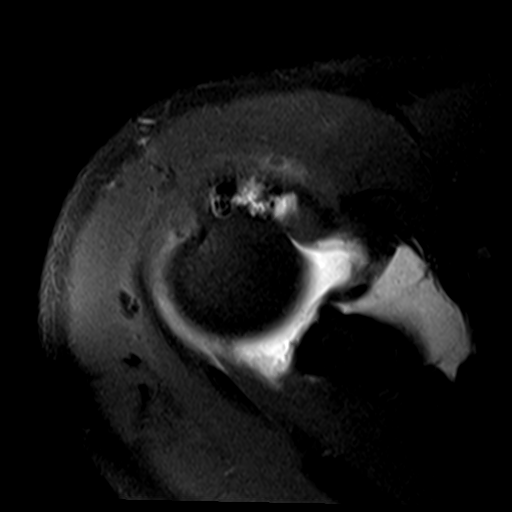
[im 15/18]
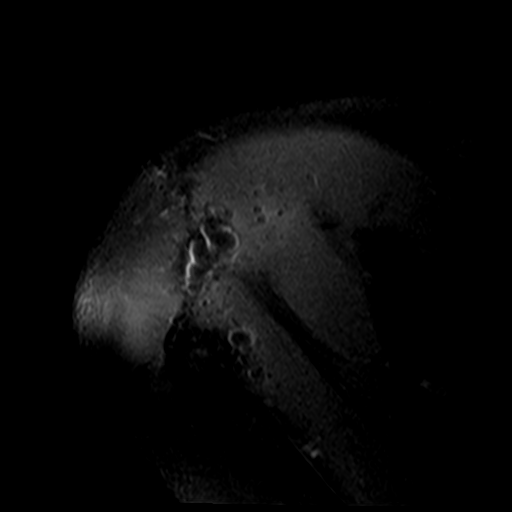
[im 18/18]
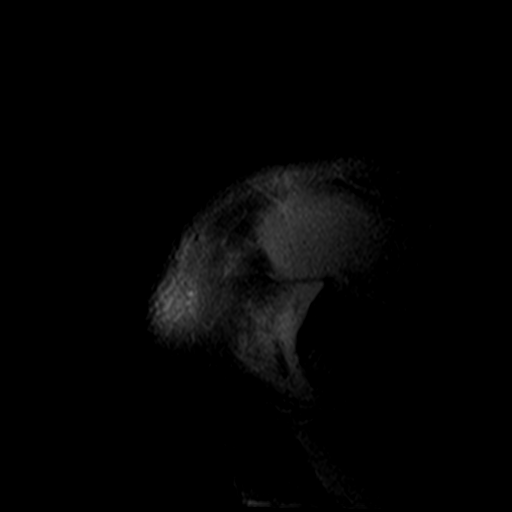

[Series 7: T1 fat-sat · oblique · 4.0mm · 0.55mm/px · 6 of 16 slices shown (3 of 4)]
[im 1/16]
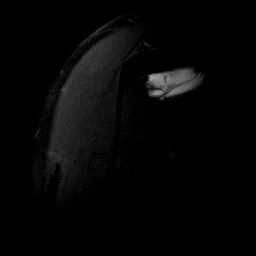
[im 4/16]
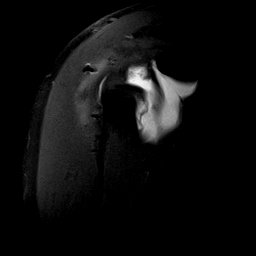
[im 7/16]
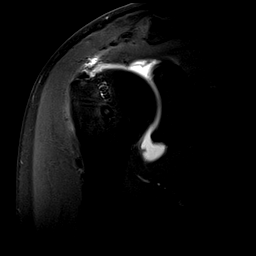
[im 10/16]
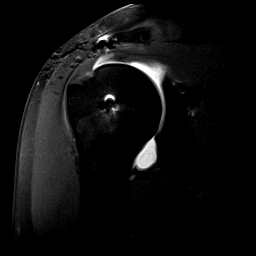
[im 13/16]
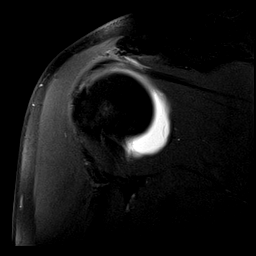
[im 16/16]
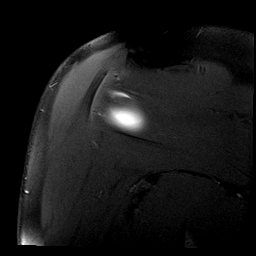

[Series 8: T2 fat-sat · oblique · 4.0mm · 0.55mm/px · 6 of 16 slices shown (2 of 2)]
[im 1/16]
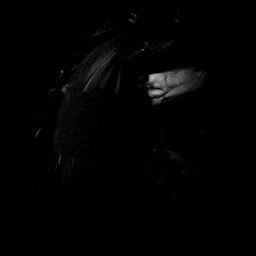
[im 4/16]
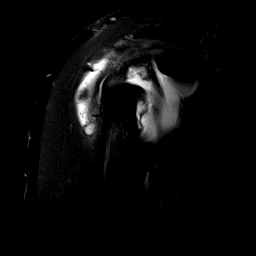
[im 7/16]
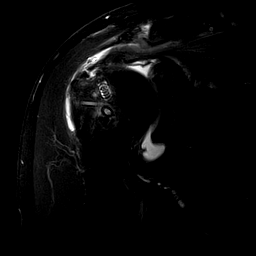
[im 10/16]
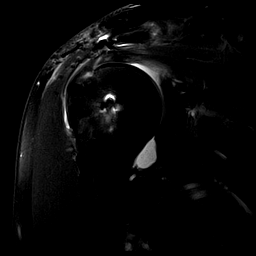
[im 13/16]
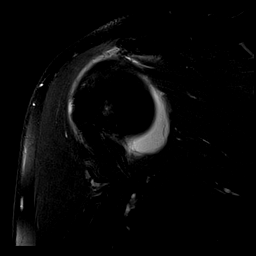
[im 16/16]
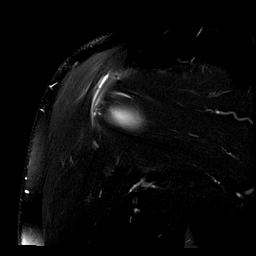

[Series 15: T1 fat-sat · sagittal · 4.0mm · 0.59mm/px · 6 of 16 slices shown (4 of 4)]
[im 1/16]
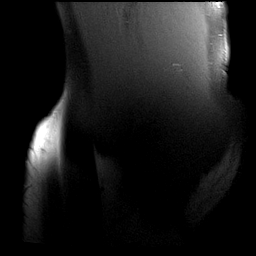
[im 4/16]
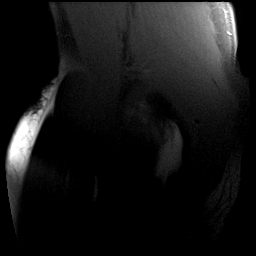
[im 7/16]
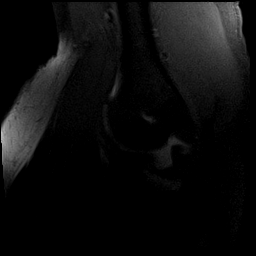
[im 10/16]
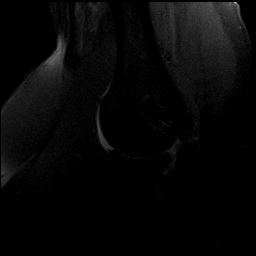
[im 13/16]
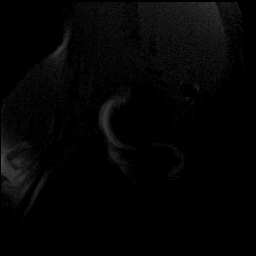
[im 16/16]
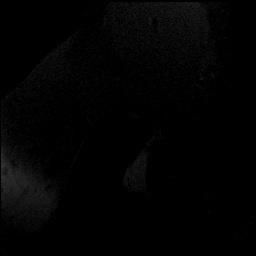

[40 of 40 positions shown; findings below may reference images not displayed]

FINDINGS: Rotator cuff: Recurrent supraspinous tendon tear with approximately
10 mm of retraction. The infraspinatus tendon demonstrates an
oblique coursing articular surface tear which extends to the bursal
surface but not definitely through it.

Muscles: Normal.

Biceps long head: Status post tenodesis.

Acromioclavicular Joint: Postoperative changes from bony
decompression. The distal clavicle has been partially resected and
surgical changes from acromioplasty.

Glenohumeral Joint: Unremarkable.

Labrum: The superior labrum has been debrided. The anterior and
posterior labor are intact.

Bones: Intact.
IMPRESSION: 1. Small recurrent supraspinous tendon tear.
2. Oblique coursing articular surface tear involving the
infraspinatus tendon.
3. Status post long head biceps tendon tenodesis.
4. Postoperative changes from bony decompression. No findings for
bony impingement.
5. Debrided superior labrum.

## 2018-04-16 ENCOUNTER — Encounter (INDEPENDENT_AMBULATORY_CARE_PROVIDER_SITE_OTHER): Payer: Self-pay | Admitting: Orthopedic Surgery

## 2018-04-16 ENCOUNTER — Ambulatory Visit (INDEPENDENT_AMBULATORY_CARE_PROVIDER_SITE_OTHER): Payer: Managed Care, Other (non HMO)

## 2018-04-16 ENCOUNTER — Ambulatory Visit (INDEPENDENT_AMBULATORY_CARE_PROVIDER_SITE_OTHER): Payer: Managed Care, Other (non HMO) | Admitting: Orthopedic Surgery

## 2018-04-16 DIAGNOSIS — R2232 Localized swelling, mass and lump, left upper limb: Secondary | ICD-10-CM

## 2018-04-16 DIAGNOSIS — M79645 Pain in left finger(s): Secondary | ICD-10-CM

## 2018-04-16 NOTE — Progress Notes (Signed)
Office Visit Note   Patient: Keith Walters           Date of Birth: July 15, 1961           MRN: [id number] Visit Date: 04/16/2018 Requested by: Lupita Raider, MD [address] [place], [place] [address] PCP: Lupita Raider, MD  Subjective: Chief Complaint  Patient presents with  . Hand Pain    left middle finger injury    HPI: Keith Walters is a patient who had a compression injury to his nondominant left hand long finger 1 month ago.  No bleeding and did not break the skin.  However a week after the injury he noticed swelling over the volar aspect of the middle phalanx of the middle finger.  Reports little bit of tightness as well as what peers to be a callus on that finger.  He is had no drainage or fevers or chills.  His shoulder is doing well from surgery.              ROS: All systems reviewed are negative as they relate to the chief complaint within the history of present illness.  Patient denies  fevers or chills.   Assessment & Plan: Visit Diagnoses:  1. Pain in left finger(s)   2. Mass of finger of left hand     Plan: Impression is epidermal inclusion cyst over the middle phalanx of the left long finger.  No evidence of infection.  No evidence of foreign body.  I think excision is indicated.  Looked at it with ultrasound and did not see anything unusual.  He would be unable to use that left hand for a lot of physical work for period of at least 2 to 3 weeks after surgery.  Patient understands the risks and benefits of surgical intervention.  All questions answered he will schedule at his convenience.  Follow-Up Instructions: Return if symptoms worsen or fail to improve.   Orders:  Orders Placed This Encounter  Procedures  . XR Finger Middle Left   No orders of the defined types were placed in this encounter.     Procedures: No procedures performed   Clinical Data: No additional findings.  Objective: Vital Signs: There were no vitals taken for this  visit.  Physical Exam:   Constitutional: Patient appears well-developed HEENT:  Head: Normocephalic Eyes:EOM are normal Neck: Normal range of motion Cardiovascular: Normal rate Pulmonary/chest: Effort normal Neurologic: Patient is alert Skin: Skin is warm Psychiatric: Patient has normal mood and affect    Ortho Exam: Ortho exam demonstrates full active and passive range of motion of all digits in the left hand except for that middle digit.  He does have a little bit of diminished PIP and DIP flexion due to this mass.  Collaterals are stable.  There is no erythema or warmth around this mass.  It does have a callus type feel to it but there is no discrete callus over the region other than a small 1 mm area where the impact was.  DIP and PIP flexion is intact.  Specialty Comments:  No specialty comments available.  Imaging: Xr Finger Middle Left  Result Date: 04/16/2018 AP lateral oblique left middle finger reviewed.  No foreign body present.  Soft tissue swelling over the middle phalanx.  Bony alignment normal    PMFS History: Patient Active Problem List   Diagnosis Date Noted  . Complete tear of right rotator cuff 07/24/2016   Past Medical History:  Diagnosis Date  .  Asthma    as a child  . Family history of adverse reaction to anesthesia    adverse reaction to all anesthesia  . GERD (gastroesophageal reflux disease)   . Hypertension   . Sleep apnea     History reviewed. No pertinent family history.  Past Surgical History:  Procedure Laterality Date  . SHOULDER ARTHROSCOPY WITH OPEN ROTATOR CUFF REPAIR Right 04/05/2016   Procedure: SHOULDER ARTHROSCOPY WITH MINI-OPEN ROTATOR CUFF REPAIR;  Surgeon: Cammy CopaScott Gregory Dean, MD;  Location: [facility] OR;  Service: Orthopedics;  Laterality: Right;  . SHOULDER ARTHROSCOPY WITH ROTATOR CUFF REPAIR AND SUBACROMIAL DECOMPRESSION Right 12/01/2015   Procedure: RIGHT SHOULDER ARTHROSCOPY WITH SUBACROMIAL DECOMPRESSION, MINI-OPEN ROTATOR CUFF  REPAIR AND  BICEPS TENODESIS;  Surgeon: Cammy CopaScott Gregory Dean, MD;  Location: [facility];  Service: Orthopedics;  Laterality: Right;  . TONSILLECTOMY    . WISDOM TOOTH EXTRACTION     Social History   Occupational History  . Not on file  Tobacco Use  . Smoking status: Former Smoker    Packs/day: 1.00    Years: 10.00    Pack years: 10.00    Types: Cigarettes    Last attempt to quit: 12/01/2007    Years since quitting: 10.3  . Smokeless tobacco: Never Used  Substance and Sexual Activity  . Alcohol use: No  . Drug use: No  . Sexual activity: Not on file
